# Patient Record
Sex: Male | Born: 1965 | Race: White | Hispanic: No | Marital: Married | Smoking: Never smoker
Health system: Southern US, Community
[De-identification: ages and names within clinical notes are randomized; demographics above are authoritative.]

## PROBLEM LIST (undated history)

## (undated) DIAGNOSIS — I10 Essential (primary) hypertension: Secondary | ICD-10-CM

## (undated) DIAGNOSIS — I4891 Unspecified atrial fibrillation: Secondary | ICD-10-CM

---

## 2010-11-30 ENCOUNTER — Ambulatory Visit: Payer: Self-pay | Admitting: Internal Medicine

## 2013-11-30 DIAGNOSIS — I491 Atrial premature depolarization: Secondary | ICD-10-CM | POA: Insufficient documentation

## 2014-03-09 LAB — BASIC METABOLIC PANEL
BUN: 14 mg/dL (ref 4–21)
Creatinine: 1 mg/dL (ref <=1.3)
Glucose: 95 mg/dL
Potassium: 4.3 mmol/L (ref 3.4–5.3)
Sodium: 142 mmol/L (ref 137–147)

## 2015-05-26 ENCOUNTER — Other Ambulatory Visit: Payer: Self-pay | Admitting: Family Medicine

## 2015-05-26 NOTE — Telephone Encounter (Signed)
PATIENT NEEDS TO SCHEDULE OFFICE VISIT

## 2015-09-26 ENCOUNTER — Other Ambulatory Visit: Payer: Self-pay | Admitting: Family Medicine

## 2015-09-29 ENCOUNTER — Telehealth: Payer: Self-pay | Admitting: Family Medicine

## 2015-09-29 NOTE — Telephone Encounter (Signed)
Pt contacted office for refill request on the following medications: Lisinopril-Hydrochlorothiazide 10-12.5 mg to [facility]. Pt was last seen on 03/09/14 and last filled on 05/13/14 with 10 refills. Pt stated he is out of the medication and scheduled a f/u for 10/11/15. Pt stated that is the quickest he could come in the office. Thanks TNP

## 2015-09-29 NOTE — Telephone Encounter (Signed)
Rx was sent into pharmacy 09/27/2015. Confirmed by pharmacy. Pt notified.

## 2015-10-10 DIAGNOSIS — I10 Essential (primary) hypertension: Secondary | ICD-10-CM | POA: Insufficient documentation

## 2015-10-10 DIAGNOSIS — R51 Headache: Secondary | ICD-10-CM

## 2015-10-10 DIAGNOSIS — R519 Headache, unspecified: Secondary | ICD-10-CM | POA: Insufficient documentation

## 2015-10-11 ENCOUNTER — Encounter: Payer: Self-pay | Admitting: Family Medicine

## 2015-10-11 ENCOUNTER — Ambulatory Visit (INDEPENDENT_AMBULATORY_CARE_PROVIDER_SITE_OTHER): Payer: Self-pay | Admitting: Family Medicine

## 2015-10-11 VITALS — BP 114/80 | HR 62 | Temp 98.8°F | Resp 16 | Ht 71.0 in | Wt 159.0 lb

## 2015-10-11 DIAGNOSIS — I10 Essential (primary) hypertension: Secondary | ICD-10-CM

## 2015-10-11 NOTE — Progress Notes (Signed)
       Patient: Peter Wyatt Male    DOB: [birth year]   49 y.o.   MRN: [id number] Visit Date: 10/11/2015  Today's Provider: Mila Merryonald Jovie Swanner, MD   Chief Complaint  Patient presents with  . Hypertension    follow up   Subjective:    HPI  Hypertension, follow-up:  BP Readings from Last 3 Encounters:  10/11/15 114/80  03/06/14 128/90    He was last seen for hypertension 1 years ago.  BP at that visit was 128/90. Management since that visit includes no changes. He reports good compliance with treatment. He is not having side effects.  He is exercising. He is not adherent to low salt diet.  Patient reports that he does not add extra salt to his meals. Outside blood pressures are not being checked. He is experiencing none.  Patient denies chest pain, chest pressure/discomfort, claudication, dyspnea, exertional chest pressure/discomfort, fatigue, irregular heart beat, lower extremity edema, near-syncope, orthopnea, palpitations, paroxysmal nocturnal dyspnea, syncope and tachypnea.   Cardiovascular risk factors include hypertension and male gender.  Use of agents associated with hypertension: none.     Weight trend: stable Wt Readings from Last 3 Encounters:  10/11/15 159 lb (72.122 kg)  03/06/14 162 lb (73.483 kg)    Current diet: Regular diet  ------------------------------------------------------------------------       No Known Allergies Previous Medications   LISINOPRIL-HYDROCHLOROTHIAZIDE (PRINZIDE,ZESTORETIC) 10-12.5 MG TABLET    Take 1 tablet by mouth daily.    Review of Systems  Constitutional: Negative for fever, chills and appetite change.  Respiratory: Negative for chest tightness, shortness of breath and wheezing.   Cardiovascular: Negative for chest pain and palpitations.  Gastrointestinal: Negative for nausea, vomiting and abdominal pain.    Social History  Substance Use Topics  . Smoking status: Never Smoker   . Smokeless tobacco: Not on file   . Alcohol Use: No   Objective:   BP 114/80 mmHg  Pulse 62  Temp(Src) 98.8 F (37.1 C) (Oral)  Resp 16  Ht 5\' 11"  (1.803 m)  Wt 159 lb (72.122 kg)  BMI 22.19 kg/m2  SpO2 98%  Physical Exam   General Appearance:    Alert, cooperative, no distress  Eyes:    PERRL, conjunctiva/corneas clear, EOM's intact       Lungs:     Clear to auscultation bilaterally, respirations unlabored  Heart:    Regular rate and rhythm  Neurologic:   Awake, alert, oriented x 3. No apparent focal neurological           defect.           Assessment & Plan:     1. Essential hypertension Well controlled.  Continue current medications.   - Lipid panel       Mila Merryonald Arhaan Chesnut, MD  [facility]

## 2015-10-12 LAB — LIPID PANEL
Chol/HDL Ratio: 2.2 ratio units (ref 0.0–5.0)
Cholesterol, Total: 123 mg/dL (ref 100–199)
HDL: 56 mg/dL (ref >39)
LDL Calculated: 49 mg/dL (ref 0–99)
Triglycerides: 88 mg/dL (ref 0–149)
VLDL Cholesterol Cal: 18 mg/dL (ref 5–40)

## 2015-10-27 ENCOUNTER — Other Ambulatory Visit: Payer: Self-pay | Admitting: Family Medicine

## 2015-10-27 MED ORDER — LISINOPRIL-HYDROCHLOROTHIAZIDE 10-12.5 MG PO TABS: 1.0000 | ORAL_TABLET | Freq: Every day | ORAL | Status: DC

## 2015-10-27 NOTE — Telephone Encounter (Signed)
Pt contacted office for refill request on the following medications:  lisinopril-hydrochlorothiazide (PRINZIDE,ZESTORETIC) 10-12.5 MG tablet.  [facility] [address]  WG#[phone]/HQCB#[phone]/MW

## 2016-11-05 ENCOUNTER — Other Ambulatory Visit: Payer: Self-pay | Admitting: Family Medicine

## 2016-11-06 ENCOUNTER — Telehealth: Payer: Self-pay | Admitting: Family Medicine

## 2016-11-06 NOTE — Telephone Encounter (Signed)
Pt contacted office for refill request on the following medications: lisinopril-hydrochlorothiazide (PRINZIDE,ZESTORETIC) 10-12.5 MG tablet  I advised pt that a 30 day supply was sent to the pharmacy today with no refills and he needed to schedule an OV for refills. Pt stated he was calling b/c he knows he needs to come in for OV but it would work better for him if he could wait until January. Pt last OV was on 10/11/15. Pt is requesting a refill to be added to the Rx that was sent to the pharmacy this morning so he has enough medication to last until January. Please advise. Thanks TNP

## 2016-12-05 ENCOUNTER — Other Ambulatory Visit: Payer: Self-pay | Admitting: Family Medicine

## 2017-01-01 ENCOUNTER — Ambulatory Visit: Payer: Self-pay | Admitting: Family Medicine

## 2017-01-07 ENCOUNTER — Encounter: Payer: Self-pay | Admitting: Family Medicine

## 2017-01-07 ENCOUNTER — Ambulatory Visit (INDEPENDENT_AMBULATORY_CARE_PROVIDER_SITE_OTHER): Payer: Self-pay | Admitting: Family Medicine

## 2017-01-07 VITALS — BP 118/76 | HR 66 | Temp 98.4°F | Resp 16 | Wt 169.0 lb

## 2017-01-07 DIAGNOSIS — I10 Essential (primary) hypertension: Secondary | ICD-10-CM

## 2017-01-07 MED ORDER — LISINOPRIL-HYDROCHLOROTHIAZIDE 10-12.5 MG PO TABS: ORAL_TABLET | ORAL | 2 refills | Status: DC

## 2017-01-07 NOTE — Progress Notes (Signed)
       Patient: Peter Wyatt Male    DOB: [birth year]   51 y.o.   MRN: [id number] Visit Date: 01/07/2017  Today's Provider: Mila Merryonald Ebunoluwa Gernert, MD   Chief Complaint  Patient presents with  . Hypertension    follow up   Subjective:    HPI  Hypertension, follow-up:  BP Readings from Last 3 Encounters:  10/11/15 114/80  03/06/14 128/90    He was last seen for hypertension 1 years ago.  BP at that visit was 114/80. Management since that visit includes no changes. He reports good compliance with treatment. He is not having side effects.  He is exercising. He is adherent to low salt diet.   Outside blood pressures are checked occasionally. He is experiencing none.  Patient denies chest pain, chest pressure/discomfort, claudication, dyspnea, exertional chest pressure/discomfort, fatigue, irregular heart beat, lower extremity edema, near-syncope, orthopnea, palpitations, paroxysmal nocturnal dyspnea, syncope and tachypnea.   Cardiovascular risk factors include hypertension.  Use of agents associated with hypertension: none.     Weight trend: increasing steadily Wt Readings from Last 3 Encounters:  10/11/15 159 lb (72.1 kg)  03/06/14 162 lb (73.5 kg)    Current diet: well balanced   ------------------------------------------------------------------------   Reports his son diagnosed with flu yesterday.   No Known Allergies   Current Outpatient Prescriptions:  .  lisinopril-hydrochlorothiazide (PRINZIDE,ZESTORETIC) 10-12.5 MG tablet, TAKE ONE TABLET BY MOUTH ONCE DAILY. APPOINTMENT NEEDED FOR FOLLOW UP., Disp: 30 tablet, Rfl: 0  Review of Systems  Constitutional: Negative for appetite change, chills and fever.  Respiratory: Negative for chest tightness, shortness of breath and wheezing.   Cardiovascular: Negative for chest pain and palpitations.  Gastrointestinal: Negative for abdominal pain, nausea and vomiting.    Social History  Substance Use Topics  . Smoking  status: Never Smoker  . Smokeless tobacco: Never Used  . Alcohol use No   Objective:   BP 118/76 (BP Location: Left Arm, Patient Position: Sitting, Cuff Size: Normal)   Pulse 66   Temp 98.4 F (36.9 C) (Oral)   Resp 16   Wt 169 lb (76.7 kg)   SpO2 98% Comment: room air  BMI 23.57 kg/m   Physical Exam   General Appearance:    Alert, cooperative, no distress  Eyes:    PERRL, conjunctiva/corneas clear, EOM's intact       Lungs:     Clear to auscultation bilaterally, respirations unlabored  Heart:    Regular rate and rhythm  Neurologic:   Awake, alert, oriented x 3. No apparent focal neurological           defect.           Assessment & Plan:     1. Essential hypertension Well controlled.  Continue current medications.  Follow up yearly if labs normal.   - lisinopril-hydrochlorothiazide (PRINZIDE,ZESTORETIC) 10-12.5 MG tablet; TAKE ONE TABLET BY MOUTH ONCE DAILY.  Dispense: 90 tablet; Refill: 2 - Renal function panel       Mila Merryonald Ohanna Gassert, MD  [facility]

## 2017-01-08 LAB — RENAL FUNCTION PANEL
Albumin: 4.5 g/dL (ref 3.5–5.5)
BUN/Creatinine Ratio: 13 (ref 9–20)
BUN: 14 mg/dL (ref 6–24)
CO2: 25 mmol/L (ref 18–29)
Calcium: 9.2 mg/dL (ref 8.7–10.2)
Chloride: 102 mmol/L (ref 96–106)
Creatinine, Ser: 1.04 mg/dL (ref 0.76–1.27)
GFR calc Af Amer: 97 mL/min/{1.73_m2} (ref >59)
GFR calc non Af Amer: 84 mL/min/{1.73_m2} (ref >59)
Glucose: 83 mg/dL (ref 65–99)
Phosphorus: 3.4 mg/dL (ref 2.5–4.5)
Potassium: 4 mmol/L (ref 3.5–5.2)
Sodium: 144 mmol/L (ref 134–144)

## 2017-01-14 ENCOUNTER — Ambulatory Visit (INDEPENDENT_AMBULATORY_CARE_PROVIDER_SITE_OTHER): Payer: Self-pay | Admitting: Family Medicine

## 2017-01-14 ENCOUNTER — Encounter: Payer: Self-pay | Admitting: Family Medicine

## 2017-01-14 VITALS — BP 122/80 | HR 60 | Temp 98.0°F | Resp 16 | Wt 165.0 lb

## 2017-01-14 DIAGNOSIS — J069 Acute upper respiratory infection, unspecified: Secondary | ICD-10-CM

## 2017-01-14 DIAGNOSIS — R05 Cough: Secondary | ICD-10-CM

## 2017-01-14 DIAGNOSIS — R059 Cough, unspecified: Secondary | ICD-10-CM

## 2017-01-14 MED ORDER — DOXYCYCLINE HYCLATE 100 MG PO TABS: 100.0000 mg | ORAL_TABLET | Freq: Two times a day (BID) | ORAL | 0 refills | Status: DC

## 2017-01-14 NOTE — Progress Notes (Signed)
       Patient: Peter Wyatt Male    DOB: 09/11/1966   51 y.o.   MRN: [id number] Visit Date: 01/14/2017  Today's Provider: Mila Merryonald Tymere Depuy, MD   Chief Complaint  Patient presents with  . Cough   Subjective:    Cough  This is a new problem. The current episode started in the past 7 days. The cough is non-productive. Associated symptoms include a fever, headaches, postnasal drip and shortness of breath. Associated symptoms comments: Up to 100.2 last night.. He has tried OTC cough suppressant for the symptoms. The treatment provided mild relief. There is no history of asthma or environmental allergies.  States his sone was diagnosed with flu with positive flu test 1 week ago. Patient started having cough day after his son was positive. States he took and Excedrin this morning due to headache.      No Known Allergies   Current Outpatient Prescriptions:  .  lisinopril-hydrochlorothiazide (PRINZIDE,ZESTORETIC) 10-12.5 MG tablet, TAKE ONE TABLET BY MOUTH ONCE DAILY., Disp: 90 tablet, Rfl: 2  Review of Systems  Constitutional: Positive for fever.  HENT: Positive for postnasal drip.   Respiratory: Positive for cough and shortness of breath.   Allergic/Immunologic: Negative for environmental allergies.  Neurological: Positive for headaches.    Social History  Substance Use Topics  . Smoking status: Never Smoker  . Smokeless tobacco: Never Used  . Alcohol use No   Objective:   BP 122/80 (BP Location: Right Arm, Patient Position: Sitting, Cuff Size: Normal)   Pulse 60   Temp 98 F (36.7 C)   Resp 16   Wt 165 lb (74.8 kg)   SpO2 98%   BMI 23.01 kg/m   Physical Exam   General Appearance:    Alert, cooperative, no distress  Eyes:    PERRL, conjunctiva/corneas clear, EOM's intact       Lungs:     Clear to auscultation bilaterally, respirations unlabored  Heart:    Regular rate and rhythm  Neurologic:   Awake, alert, oriented x 3. No apparent focal neurological            defect.           Assessment & Plan:     1. Upper respiratory tract infection, unspecified type He does have household exposure to flu positive child, but has been 6 days since onset of symptoms. He is concerned about secondary infections and had persistent case of suspected whooping cough a few years ago.  Counseled regarding signs and symptoms of viral and bacterial respiratory infections. Advised to start doxycycline  if he develops any sign of bacterial infection, or if current symptoms last longer than 10 days.   - doxycycline (VIBRA-TABS) 100 MG tablet; Take 1 tablet (100 mg total) by mouth 2 (two) times daily.  Dispense: 20 tablet; Refill: 0  2. Cough Improved today. Has responded well to OTC Delsym.        Mila Merryonald Shuronda Santino, MD  [facility]

## 2017-01-14 NOTE — Patient Instructions (Addendum)
   Fill prescription for doxycycline if you take a turn for the worse of if you are not much better within 5 days

## 2017-10-18 ENCOUNTER — Other Ambulatory Visit: Payer: Self-pay | Admitting: Family Medicine

## 2017-10-18 DIAGNOSIS — I10 Essential (primary) hypertension: Secondary | ICD-10-CM

## 2018-01-07 ENCOUNTER — Encounter: Payer: Self-pay | Admitting: Family Medicine

## 2018-01-07 ENCOUNTER — Telehealth: Payer: Self-pay | Admitting: Family Medicine

## 2018-01-07 ENCOUNTER — Ambulatory Visit: Payer: BLUE CROSS/BLUE SHIELD | Admitting: Family Medicine

## 2018-01-07 VITALS — BP 110/64 | HR 68 | Temp 97.9°F | Resp 16 | Ht 71.5 in | Wt 174.0 lb

## 2018-01-07 DIAGNOSIS — K1379 Other lesions of oral mucosa: Secondary | ICD-10-CM | POA: Diagnosis not present

## 2018-01-07 DIAGNOSIS — I10 Essential (primary) hypertension: Secondary | ICD-10-CM | POA: Diagnosis not present

## 2018-01-07 DIAGNOSIS — Z125 Encounter for screening for malignant neoplasm of prostate: Secondary | ICD-10-CM

## 2018-01-07 DIAGNOSIS — R361 Hematospermia: Secondary | ICD-10-CM

## 2018-01-07 DIAGNOSIS — Z1211 Encounter for screening for malignant neoplasm of colon: Secondary | ICD-10-CM

## 2018-01-07 DIAGNOSIS — K130 Diseases of lips: Secondary | ICD-10-CM

## 2018-01-07 MED ORDER — NAPROXEN 500 MG PO TABS: 500.0000 mg | ORAL_TABLET | Freq: Two times a day (BID) | ORAL | 0 refills | Status: AC

## 2018-01-07 NOTE — Patient Instructions (Signed)
   Call for referral to urology is you have any more episodes of blood in semen

## 2018-01-07 NOTE — Progress Notes (Signed)
Patient: Peter Wyatt Male    DOB: 1966/10/19   52 y.o.   MRN: [id number] Visit Date: 01/07/2018  Today's Provider: Mila Merryonald Norm Wray, MD   Chief Complaint  Patient presents with  . Hypertension   Subjective:    HPI  Hypertension, follow-up:  BP Readings from Last 3 Encounters:  01/07/18 110/64  01/14/17 122/80  01/07/17 118/76    He was last seen for hypertension 1 years ago.  BP at that visit was 118/76. Management since that visit includes no changes. He reports good compliance with treatment. He is not having side effects.  He is not exercising. He is adherent to low salt diet.   Outside blood pressures are not being checked. He is experiencing none.  Patient denies chest pressure/discomfort, exertional chest pressure/discomfort and palpitations.    Weight trend: stable Wt Readings from Last 3 Encounters:  01/07/18 174 lb (78.9 kg)  01/14/17 165 lb (74.8 kg)  01/07/17 169 lb (76.7 kg)    Current diet: well balanced   He complains that his left knee has been bothering for a couple of weeks which he thinks he injured playing sports. He has an active job on his feet all day. Doesn't recall specific injury or popping of knee. Has taken some OTC Excedrin when it keeps him up at night  Also complains of two episodes of blood in semen over the last couple of months. No pain. Has since had two ejaculations that were clear.   He also has a bump on his bottom lip that has been present for a few years. He thinks it started when he bit his lip. It will start to heal up but never goes completely away. Every few weeks or so he will bite it on accident and it will swell back up.      No Known Allergies   Current Outpatient Medications:  .  lisinopril-hydrochlorothiazide (PRINZIDE,ZESTORETIC) 10-12.5 MG tablet, TAKE ONE TABLET BY MOUTH ONCE DAILY, Disp: 90 tablet, Rfl: 3  Review of Systems  Constitutional: Negative.   Respiratory: Negative for cough.     Cardiovascular: Negative for chest pain, palpitations and leg swelling.  Musculoskeletal: Negative.   Neurological: Negative for dizziness, tremors, weakness, light-headedness, numbness and headaches.    Social History   Tobacco Use  . Smoking status: Never Smoker  . Smokeless tobacco: Never Used  Substance Use Topics  . Alcohol use: No   Objective:   BP 110/64 (BP Location: Right Arm, Patient Position: Sitting, Cuff Size: Normal)   Pulse 68   Temp 97.9 F (36.6 C)   Resp 16   Ht 5' 11.5" (1.816 m)   Wt 174 lb (78.9 kg)   SpO2 98%   BMI 23.93 kg/m  Vitals:   01/07/18 0801  BP: 110/64  Pulse: 68  Resp: 16  Temp: 97.9 F (36.6 C)  SpO2: 98%  Weight: 174 lb (78.9 kg)  Height: 5' 11.5" (1.816 m)     Physical Exam   General Appearance:    Alert, cooperative, no distress  Eyes:    PERRL, conjunctiva/corneas clear, EOM's intact       Lungs:     Clear to auscultation bilaterally, respirations unlabored  Heart:    Regular rate and rhythm  Neurologic:   Awake, alert, oriented x 3. No apparent focal neurological           defect.   MS:   Tender left biceps femoris tendon. No swelling or gross  deformities.        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  - Ambulatory referral to ENT  2. Blood in semen Advised that this is usually benign symptoms, however if he has any more episodes he should see urology for further evaluation.   3. Essential hypertension Well controlled.  Continue current medications.   - Lipid panel - Comprehensive metabolic panel  4. Prostate cancer screening  - PSA  5. Colon cancer screening  - Cologuard       [name], MD  [facility]

## 2018-01-07 NOTE — Telephone Encounter (Signed)
Order for cologuard faxed to [facility] °

## 2018-01-08 LAB — COMPREHENSIVE METABOLIC PANEL
ALT: 19 IU/L (ref 0–44)
AST: 23 IU/L (ref 0–40)
Albumin/Globulin Ratio: 2 (ref 1.2–2.2)
Albumin: 4.6 g/dL (ref 3.5–5.5)
Alkaline Phosphatase: 53 IU/L (ref 39–117)
BUN/Creatinine Ratio: 18 (ref 9–20)
BUN: 16 mg/dL (ref 6–24)
Bilirubin Total: 0.9 mg/dL (ref 0.0–1.2)
CO2: 27 mmol/L (ref 20–29)
Calcium: 9.5 mg/dL (ref 8.7–10.2)
Chloride: 99 mmol/L (ref 96–106)
Creatinine, Ser: 0.91 mg/dL (ref 0.76–1.27)
GFR calc Af Amer: 113 mL/min/{1.73_m2} (ref >59)
GFR calc non Af Amer: 98 mL/min/{1.73_m2} (ref >59)
Globulin, Total: 2.3 g/dL (ref 1.5–4.5)
Glucose: 88 mg/dL (ref 65–99)
Potassium: 4.3 mmol/L (ref 3.5–5.2)
Sodium: 141 mmol/L (ref 134–144)
Total Protein: 6.9 g/dL (ref 6.0–8.5)

## 2018-01-08 LAB — LIPID PANEL
Chol/HDL Ratio: 2.3 ratio (ref 0.0–5.0)
Cholesterol, Total: 127 mg/dL (ref 100–199)
HDL: 55 mg/dL (ref >39)
LDL Calculated: 53 mg/dL (ref 0–99)
Triglycerides: 94 mg/dL (ref 0–149)
VLDL Cholesterol Cal: 19 mg/dL (ref 5–40)

## 2018-01-08 LAB — PSA: Prostate Specific Ag, Serum: 0.6 ng/mL (ref 0.0–4.0)

## 2018-01-19 DIAGNOSIS — Z1211 Encounter for screening for malignant neoplasm of colon: Secondary | ICD-10-CM | POA: Diagnosis not present

## 2018-01-20 LAB — COLOGUARD: Cologuard: NEGATIVE

## 2018-02-09 DIAGNOSIS — D100 Benign neoplasm of lip: Secondary | ICD-10-CM | POA: Diagnosis not present

## 2018-02-09 DIAGNOSIS — D3701 Neoplasm of uncertain behavior of lip: Secondary | ICD-10-CM | POA: Diagnosis not present

## 2018-02-09 DIAGNOSIS — R52 Pain, unspecified: Secondary | ICD-10-CM | POA: Diagnosis not present

## 2018-02-09 DIAGNOSIS — K1379 Other lesions of oral mucosa: Secondary | ICD-10-CM | POA: Diagnosis not present

## 2018-03-24 ENCOUNTER — Telehealth: Payer: Self-pay

## 2018-03-24 NOTE — Telephone Encounter (Signed)
-----   Message from Malva Limesonald E Fisher, MD sent at 03/24/2018 12:28 PM EDT ----- Cologuard test is negative. Repeat in 3 years.

## 2018-03-24 NOTE — Telephone Encounter (Signed)
Patient was notified of results on 01/29/18  Notes are under labs.  Thanks,  -Anne Boltz

## 2018-04-01 ENCOUNTER — Inpatient Hospital Stay
Admission: EM | Admit: 2018-04-01 | Discharge: 2018-04-02 | DRG: 310 | Disposition: A | Discharge status: Home / Self Care | Payer: BLUE CROSS/BLUE SHIELD | Attending: Internal Medicine | Admitting: Internal Medicine

## 2018-04-01 ENCOUNTER — Emergency Department: Payer: BLUE CROSS/BLUE SHIELD

## 2018-04-01 ENCOUNTER — Encounter: Payer: Self-pay | Admitting: Emergency Medicine

## 2018-04-01 DIAGNOSIS — R079 Chest pain, unspecified: Secondary | ICD-10-CM | POA: Diagnosis not present

## 2018-04-01 DIAGNOSIS — I10 Essential (primary) hypertension: Secondary | ICD-10-CM | POA: Diagnosis present

## 2018-04-01 DIAGNOSIS — Z79899 Other long term (current) drug therapy: Secondary | ICD-10-CM

## 2018-04-01 DIAGNOSIS — E876 Hypokalemia: Secondary | ICD-10-CM | POA: Diagnosis not present

## 2018-04-01 DIAGNOSIS — Z8679 Personal history of other diseases of the circulatory system: Secondary | ICD-10-CM | POA: Diagnosis present

## 2018-04-01 DIAGNOSIS — I4891 Unspecified atrial fibrillation: Secondary | ICD-10-CM

## 2018-04-01 LAB — BASIC METABOLIC PANEL
Anion gap: 9 (ref 5–15)
BUN: 14 mg/dL (ref 6–20)
CO2: 26 mmol/L (ref 22–32)
Calcium: 9.5 mg/dL (ref 8.9–10.3)
Chloride: 103 mmol/L (ref 101–111)
Creatinine, Ser: 0.99 mg/dL (ref 0.61–1.24)
GFR calc Af Amer: >60 mL/min (ref >60)
GFR calc non Af Amer: >60 mL/min (ref >60)
Glucose, Bld: 197 mg/dL — ABNORMAL HIGH (ref 65–99)
Potassium: 3.1 mmol/L — ABNORMAL LOW (ref 3.5–5.1)
Sodium: 138 mmol/L (ref 135–145)

## 2018-04-01 LAB — CBC
HCT: 47.3 % (ref 40.0–52.0)
Hemoglobin: 16.7 g/dL (ref 13.0–18.0)
MCH: 30.1 pg (ref 26.0–34.0)
MCHC: 35.2 g/dL (ref 32.0–36.0)
MCV: 85.6 fL (ref 80.0–100.0)
Platelets: 163 10*3/uL (ref 150–440)
RBC: 5.53 MIL/uL (ref 4.40–5.90)
RDW: 13.1 % (ref 11.5–14.5)
WBC: 9 10*3/uL (ref 3.8–10.6)

## 2018-04-01 LAB — TROPONIN I: Troponin I: <0.03 ng/mL (ref <0.03)

## 2018-04-01 LAB — GLUCOSE, CAPILLARY: Glucose-Capillary: 153 mg/dL — ABNORMAL HIGH (ref 65–99)

## 2018-04-01 MED ORDER — MAGNESIUM SULFATE 4 GM/100ML IV SOLN
INTRAVENOUS | Status: AC
  Administered 2018-04-01: 4 g via INTRAVENOUS
  Filled 2018-04-01: qty 100

## 2018-04-01 MED ORDER — DILTIAZEM HCL 100 MG IV SOLR
5.0000 mg/h | Freq: Once | INTRAVENOUS | Status: AC
  Administered 2018-04-01: 5 mg/h via INTRAVENOUS
  Administered 2018-04-01: 10 mg/h via INTRAVENOUS
  Filled 2018-04-01: qty 100

## 2018-04-01 MED ORDER — SODIUM CHLORIDE 0.9 % IV BOLUS
1000.0000 mL | Freq: Once | INTRAVENOUS | Status: AC
  Administered 2018-04-01: 1000 mL via INTRAVENOUS

## 2018-04-01 MED ORDER — POTASSIUM CHLORIDE CRYS ER 20 MEQ PO TBCR
40.0000 meq | EXTENDED_RELEASE_TABLET | Freq: Once | ORAL | Status: AC
  Administered 2018-04-01: 40 meq via ORAL
  Filled 2018-04-01: qty 2

## 2018-04-01 MED ORDER — DILTIAZEM HCL 25 MG/5ML IV SOLN
20.0000 mg | Freq: Once | INTRAVENOUS | Status: AC
  Administered 2018-04-01: 20 mg via INTRAVENOUS

## 2018-04-01 MED ORDER — DILTIAZEM HCL 25 MG/5ML IV SOLN
INTRAVENOUS | Status: AC
  Administered 2018-04-01: 20 mg via INTRAVENOUS
  Filled 2018-04-01: qty 5

## 2018-04-01 MED ORDER — DILTIAZEM HCL 60 MG PO TABS: 90.0000 mg | ORAL_TABLET | Freq: Once | ORAL | Status: DC

## 2018-04-01 MED ORDER — DILTIAZEM HCL 60 MG PO TABS
60.0000 mg | ORAL_TABLET | Freq: Once | ORAL | Status: AC
  Administered 2018-04-01: 60 mg via ORAL
  Filled 2018-04-01: qty 1

## 2018-04-01 MED ORDER — LORAZEPAM 0.5 MG PO TABS
0.5000 mg | ORAL_TABLET | Freq: Once | ORAL | Status: AC
  Administered 2018-04-01: 0.5 mg via ORAL
  Filled 2018-04-01: qty 1

## 2018-04-01 MED ORDER — MAGNESIUM SULFATE 4 GM/100ML IV SOLN
4.0000 g | Freq: Once | INTRAVENOUS | Status: AC
  Administered 2018-04-01: 4 g via INTRAVENOUS
  Filled 2018-04-01: qty 100

## 2018-04-01 NOTE — ED Provider Notes (Signed)
[facility] Emergency Department Provider Note    First MD Initiated Contact with Patient 04/01/18 2103     (approximate)  I have reviewed the triage vital signs and the nursing notes.   HISTORY  Chief Complaint Palpitations    HPI Peter Wyatt is a 52 y.o. male resents with chief complaint of palpitations that started right around 52 tonight after the patient was playing basketball with his son.  States that he has had a long history of intermittent fluttering feeling in his chest and has had to be admitted to the hospital back in the early 1990s for what sounds like SVT or even ventricular tachycardia but is not on any anticoagulation or any anti-dysrhythmic medications chronically.  States he typically after he feels these fluttering sensations he is able to take a slow deep breath and calm himself and stop the palpitations but today it became more persistent.  History reviewed. No pertinent past medical history. History reviewed. No pertinent family history. History reviewed. No pertinent surgical history. Patient Active Problem List   Diagnosis Date Noted  . Headache 10/10/2015  . Hypertension 10/10/2015  . Premature atrial contractions 11/30/2013      Prior to Admission medications   Medication Sig Start Date End Date Taking? Authorizing Provider  lisinopril-hydrochlorothiazide (PRINZIDE,ZESTORETIC) 10-12.5 MG tablet TAKE ONE TABLET BY MOUTH ONCE DAILY 10/19/17   Malva Limes, MD    Allergies Patient has no known allergies.    Social History Social History   Tobacco Use  . Smoking status: Never Smoker  . Smokeless tobacco: Never Used  Substance Use Topics  . Alcohol use: No  . Drug use: No    Review of Systems Patient denies headaches, rhinorrhea, blurry vision, numbness, shortness of breath, chest pain, edema, cough, abdominal pain, nausea, vomiting, diarrhea, dysuria, fevers, rashes or hallucinations unless otherwise  stated above in HPI. ____________________________________________   PHYSICAL EXAM:  VITAL SIGNS: Vitals:   04/01/18 2055  BP: (!) 129/116  Pulse: 83  Resp: 14  SpO2: 100%    Constitutional: Alert and oriented. anxious appearing but in no acute distress. Eyes: Conjunctivae are normal.  Head: Atraumatic. Nose: No congestion/rhinnorhea. Mouth/Throat: Mucous membranes are moist.   Neck: No stridor. Painless ROM.  Cardiovascular: tachycardic, irregularly irregular rhythm. Grossly normal heart sounds.  Good peripheral circulation. Respiratory: Normal respiratory effort.  No retractions. Lungs CTAB. Gastrointestinal: Soft and nontender. No distention. No abdominal bruits. No CVA tenderness. Genitourinary:  Musculoskeletal: No lower extremity tenderness nor edema.  No joint effusions. Neurologic:  Normal speech and language. No gross focal neurologic deficits are appreciated. No facial droop Skin:  Skin is warm, dry and intact. No rash noted. Psychiatric: Mood and affect are normal. Speech and behavior are normal.  ____________________________________________   LABS (all labs ordered are listed, but only abnormal results are displayed)  Results for orders placed or performed during the hospital encounter of 04/01/18 (from the past 24 hour(s))  Basic metabolic panel     Status: Abnormal   Collection Time: 04/01/18  8:52 PM  Result Value Ref Range   Sodium 138 135 - 145 mmol/L   Potassium 3.1 (L) 3.5 - 5.1 mmol/L   Chloride 103 101 - 111 mmol/L   CO2 26 22 - 32 mmol/L   Glucose, Bld 197 (H) 65 - 99 mg/dL   BUN 14 6 - 20 mg/dL   Creatinine, Ser 5.27 0.61 - 1.24 mg/dL   Calcium 9.5 8.9 - 78.2 mg/dL  GFR calc non Af Amer >60 >60 mL/min   GFR calc Af Amer >60 >60 mL/min   Anion gap 9 5 - 15  CBC     Status: None   Collection Time: 04/01/18  8:52 PM  Result Value Ref Range   WBC 9.0 3.8 - 10.6 K/uL   RBC 5.53 4.40 - 5.90 MIL/uL   Hemoglobin 16.7 13.0 - 18.0 g/dL   HCT 82.947.3  56.240.0 - 13.052.0 %   MCV 85.6 80.0 - 100.0 fL   MCH 30.1 26.0 - 34.0 pg   MCHC 35.2 32.0 - 36.0 g/dL   RDW 86.513.1 78.411.5 - 69.614.5 %   Platelets 163 150 - 440 K/uL  Troponin I     Status: None   Collection Time: 04/01/18  8:52 PM  Result Value Ref Range   Troponin I <0.03 <0.03 ng/mL  Glucose, capillary     Status: Abnormal   Collection Time: 04/01/18 10:27 PM  Result Value Ref Range   Glucose-Capillary 153 (H) 65 - 99 mg/dL   ____________________________________________  EKG My review and personal interpretation at Time: 20:49   Indication: palpitations  Rate: 150  Rhythm: afib with rvr Axis: normal Other: no stemi, nonspecific st and t wave abn, likely rate dependent ____________________________________________  RADIOLOGY  I personally reviewed all radiographic images ordered to evaluate for the above acute complaints and reviewed radiology reports and findings.  These findings were personally discussed with the patient.  Please see medical record for radiology report.  ____________________________________________   PROCEDURES  Procedure(s) performed:  .Critical Care Performed by: Willy Eddyobinson, Tamaiya Bump, MD Authorized by: Willy Eddyobinson, Ambre Kobayashi, MD   Critical care provider statement:    Critical care time (minutes):  35   Critical care time was exclusive of:  Separately billable procedures and treating other patients   Critical care was necessary to treat or prevent imminent or life-threatening deterioration of the following conditions:  Cardiac failure   Critical care was time spent personally by me on the following activities:  Development of treatment plan with patient or surrogate, discussions with consultants, evaluation of patient's response to treatment, examination of patient, obtaining history from patient or surrogate, ordering and performing treatments and interventions, ordering and review of laboratory studies, ordering and review of radiographic studies, pulse oximetry,  re-evaluation of patient's condition and review of old charts      Critical Care performed: yes ____________________________________________   INITIAL IMPRESSION / ASSESSMENT AND PLAN / ED COURSE  Pertinent labs & imaging results that were available during my care of the patient were reviewed by me and considered in my medical decision making (see chart for details).  DDX: afib, dehydration, sepsis, electrolyte abn,  Peter Wyatt is a 52 y.o. who presents to the ED with palpitations and chest discomfort as described above.  Patient has evidence of A. fib with RVR.  Based on the paroxysmal on and off nature of this I do not feel that he is a candidate for ER cardioversion.  Will start with IV Cardizem bolus and drip as well as oral Cardizem and IV magnesium.  Blood work checked to evaluate for electrolyte abnormality shows mildly low potassium which will be repleted orally.  Clinical Course as of Apr 01 2320  Wed Apr 01, 2018  2215 Right overall is improving.  Will give additional bolus of IV Cardizem and reduce drip.  Blood work is otherwise reassuring does have mildly low potassium.   [PR]  2255 Patient with recurrent heart rate jumping  up to the 150s anytime he moves.  I have redosed oral as well as we will increase his IV drip.  May require admission to the hospital for unable to obtain adequate rate control.   [PR]    Clinical Course User Index [PR] Willy Eddy, MD     As part of my medical decision making, I reviewed the following data within the electronic MEDICAL RECORD NUMBER Nursing notes reviewed and incorporated, Labs reviewed, notes from prior ED visits and [place] Controlled Substance Database   ____________________________________________   FINAL CLINICAL IMPRESSION(S) / ED DIAGNOSES  Final diagnoses:  Atrial fibrillation with RVR (HCC)      NEW MEDICATIONS STARTED DURING THIS VISIT:  New Prescriptions   No medications on file     Note:  This  document was prepared using Dragon voice recognition software and may include unintentional dictation errors.    Willy Eddy, MD 04/01/18 [id number]

## 2018-04-01 NOTE — ED Notes (Signed)
ED Provider at bedside. 

## 2018-04-01 NOTE — ED Notes (Signed)
Give patient one time Cardizem 10mg  bolus, and place on 5mg /hr rate after bolus per Roxan Hockeyobinson MD [facility].

## 2018-04-01 NOTE — ED Triage Notes (Signed)
Pt c/o palpitations and irregular heart rate since 1930 tonight. Pt reports episode of same in 1995. Pt taken to [facility].

## 2018-04-02 ENCOUNTER — Other Ambulatory Visit: Payer: Self-pay

## 2018-04-02 ENCOUNTER — Inpatient Hospital Stay
Admission: EM | Admit: 2018-04-02 | Discharge: 2018-04-02 | Disposition: A | Discharge status: Home / Self Care | Payer: BLUE CROSS/BLUE SHIELD | Source: Home / Self Care | Attending: Internal Medicine | Admitting: Internal Medicine

## 2018-04-02 DIAGNOSIS — E876 Hypokalemia: Secondary | ICD-10-CM | POA: Diagnosis present

## 2018-04-02 DIAGNOSIS — I10 Essential (primary) hypertension: Secondary | ICD-10-CM | POA: Diagnosis not present

## 2018-04-02 DIAGNOSIS — Z79899 Other long term (current) drug therapy: Secondary | ICD-10-CM | POA: Diagnosis not present

## 2018-04-02 DIAGNOSIS — Z8679 Personal history of other diseases of the circulatory system: Secondary | ICD-10-CM | POA: Diagnosis present

## 2018-04-02 DIAGNOSIS — I4891 Unspecified atrial fibrillation: Secondary | ICD-10-CM | POA: Diagnosis not present

## 2018-04-02 LAB — CBC
HCT: 46.2 % (ref 40.0–52.0)
Hemoglobin: 16 g/dL (ref 13.0–18.0)
MCH: 30.1 pg (ref 26.0–34.0)
MCHC: 34.7 g/dL (ref 32.0–36.0)
MCV: 86.8 fL (ref 80.0–100.0)
Platelets: 163 10*3/uL (ref 150–440)
RBC: 5.33 MIL/uL (ref 4.40–5.90)
RDW: 13.1 % (ref 11.5–14.5)
WBC: 6.3 10*3/uL (ref 3.8–10.6)

## 2018-04-02 LAB — ECHOCARDIOGRAM COMPLETE
Height: 72 in
Weight: 2700.8 oz

## 2018-04-02 LAB — BASIC METABOLIC PANEL
Anion gap: 7 (ref 5–15)
BUN: 13 mg/dL (ref 6–20)
CO2: 24 mmol/L (ref 22–32)
Calcium: 8.5 mg/dL — ABNORMAL LOW (ref 8.9–10.3)
Chloride: 110 mmol/L (ref 101–111)
Creatinine, Ser: 0.76 mg/dL (ref 0.61–1.24)
GFR calc Af Amer: >60 mL/min (ref >60)
GFR calc non Af Amer: >60 mL/min (ref >60)
Glucose, Bld: 99 mg/dL (ref 65–99)
Potassium: 3.6 mmol/L (ref 3.5–5.1)
Sodium: 141 mmol/L (ref 135–145)

## 2018-04-02 LAB — GLUCOSE, CAPILLARY: Glucose-Capillary: 114 mg/dL — ABNORMAL HIGH (ref 65–99)

## 2018-04-02 MED ORDER — DILTIAZEM HCL 60 MG PO TABS
ORAL_TABLET | ORAL | Status: AC
  Filled 2018-04-02: qty 1

## 2018-04-02 MED ORDER — ONDANSETRON HCL 4 MG/2ML IJ SOLN: 4.0000 mg | Freq: Four times a day (QID) | INTRAMUSCULAR | Status: DC | PRN

## 2018-04-02 MED ORDER — HYDROCODONE-ACETAMINOPHEN 5-325 MG PO TABS: 1.0000 | ORAL_TABLET | ORAL | Status: DC | PRN

## 2018-04-02 MED ORDER — ACETAMINOPHEN 650 MG RE SUPP: 650.0000 mg | Freq: Four times a day (QID) | RECTAL | Status: DC | PRN

## 2018-04-02 MED ORDER — ONDANSETRON HCL 4 MG PO TABS: 4.0000 mg | ORAL_TABLET | Freq: Four times a day (QID) | ORAL | Status: DC | PRN

## 2018-04-02 MED ORDER — DOCUSATE SODIUM 100 MG PO CAPS: 100.0000 mg | ORAL_CAPSULE | Freq: Two times a day (BID) | ORAL | Status: DC

## 2018-04-02 MED ORDER — HEPARIN SODIUM (PORCINE) 5000 UNIT/ML IJ SOLN: 5000.0000 [IU] | Freq: Three times a day (TID) | INTRAMUSCULAR | Status: DC

## 2018-04-02 MED ORDER — POTASSIUM CHLORIDE 10 MEQ/100ML IV SOLN
10.0000 meq | INTRAVENOUS | Status: DC | PRN
  Filled 2018-04-02: qty 100

## 2018-04-02 MED ORDER — DILTIAZEM HCL ER COATED BEADS 240 MG PO CP24: 240.0000 mg | ORAL_CAPSULE | Freq: Every day | ORAL | 1 refills | Status: DC

## 2018-04-02 MED ORDER — ACETAMINOPHEN 325 MG PO TABS: 650.0000 mg | ORAL_TABLET | Freq: Four times a day (QID) | ORAL | Status: DC | PRN

## 2018-04-02 MED ORDER — BISACODYL 5 MG PO TBEC: 5.0000 mg | DELAYED_RELEASE_TABLET | Freq: Every day | ORAL | Status: DC | PRN

## 2018-04-02 NOTE — Discharge Summary (Signed)
[facility] - [place] at [facility]  Peter Wyatt, [place]51 y[place]o., DOB September 26, 1966, MRN [id number]. Admission date: 04/01/2018 Discharge Date 04/02/2018 Primary MD Malva LimesFisher, Donald E, MD Admitting Physician Cammy CopaAngela Maier, MD  Admission Diagnosis   1.  New onset atrial fibrillation 2.  Hypertension  Discharge Diagnosis       1.  Hypertension 2.  Atrial fibrillation resolved  Hospital Course  52 year old male patient with history of hypertension was admitted on 04/02/2018 for new onset atrial fibrillation to telemetry.  He was started on IV Cardizem drip and rate was controlled.  Patient converted to sinus rhythm.  Cardiology consultation was done by Dr. Juliann Paresallwood who recommended to stop HCTZ and lisinopril and start patient on oral Cardizem 240 mg daily at the time of discharge.  Patient was worked up with echocardiogram which showed EF of 60-65% with no wall motion abnormality.  His troponin was negative in the hospital.  Patient hemodynamically stable will be discharged home follow-up with primary care physician clinic.  Consults   Cardiology Dr. Juliann Paresallwood  Significant Tests:  See full reports for all details    Dg Chest 2 View  Result Date: 04/01/2018 CLINICAL DATA:  Chest pain, palpitations and irregular heart rate. EXAM: CHEST - 2 VIEW COMPARISON:  None. FINDINGS: Cardiomegaly with minimal aortic atherosclerosis. Pulmonary hyperinflation without edema, pneumothorax, effusion or pulmonary consolidation. No acute osseous abnormality. IMPRESSION: Mild cardiomegaly with minimal aortic atherosclerosis. No active pulmonary disease. Electronically Signed   By: Tollie Ethavid  Kwon M.D.   On: 04/01/2018 21:11       [name] is a 52 year old male patient lying on the bed in no acute distress No chest pain No shortness of breath No palpitations  Objective:   Blood pressure (!) 103/91, pulse 63, temperature 98.6 F (37 C), temperature source Oral, resp.  rate 20, height 6' (1.829 m), weight 76.6 kg (168 lb 12.8 oz), SpO2 100 %.  .  Intake/Output Summary (Last 24 hours) at 04/02/2018 1437 Last data filed at 04/02/2018 0300 Gross per 24 hour  Intake 1100 ml  Output 660 ml  Net 440 ml    Exam VITAL SIGNS: Blood pressure (!) 103/91, pulse 63, temperature 98.6 F (37 C), temperature source Oral, resp. rate 20, height 6' (1.829 m), weight 76.6 kg (168 lb 12.8 oz), SpO2 100 %.  GENERAL:  52 y.o.-year-old patient lying in the bed with no acute distress.  EYES: Pupils equal, round, reactive to light and accommodation. No scleral icterus. Extraocular muscles intact.  HEENT: Head atraumatic, normocephalic. Oropharynx and nasopharynx clear.  NECK:  Supple, no jugular venous distention. No thyroid enlargement, no tenderness.  LUNGS: Normal breath sounds bilaterally, no wheezing, rales,rhonchi or crepitation. No use of accessory muscles of respiration.  CARDIOVASCULAR: S1, S2 normal. No murmurs, rubs, or gallops.  ABDOMEN: Soft, nontender, nondistended. Bowel sounds present. No organomegaly or mass.  EXTREMITIES: No pedal edema, cyanosis, or clubbing.  NEUROLOGIC: Cranial nerves II through XII are intact. Muscle strength 5/5 in all extremities. Sensation intact. Gait not checked.  PSYCHIATRIC: The patient is alert and oriented x 3.  SKIN: No obvious rash, lesion, or ulcer.   Data Review     CBC w Diff:  Lab Results  Component Value Date   WBC 6.3 04/02/2018   HGB 16.0 04/02/2018   HCT 46.2 04/02/2018   PLT 163 04/02/2018   CMP:  Lab Results  Component Value Date   NA 141 04/02/2018   NA  141 01/07/2018   K 3.6 04/02/2018   CL 110 04/02/2018   CO2 24 04/02/2018   BUN 13 04/02/2018   BUN 16 01/07/2018   CREATININE 0.76 04/02/2018   GLU 95 03/09/2014   PROT 6.9 01/07/2018   ALBUMIN 4.6 01/07/2018   BILITOT 0.9 01/07/2018   ALKPHOS 53 01/07/2018   AST 23 01/07/2018   ALT 19 01/07/2018  .  Micro Results No results found for this  or any previous visit (from the past 240 hour(s)).      Code Status Orders  (From admission, onward)        Start     Ordered   04/02/18 0429  Full code  Continuous     04/02/18 0428    Code Status History    This patient has a current code status but no historical code status.          Follow-up Information    Malva Limes, MD Follow up in 1 week(s).   Specialty:  Family Medicine Contact information: [address] [place] [address] [phone]           Discharge Medications   Allergies as of 04/02/2018   No Known Allergies     Medication List    STOP taking these medications   lisinopril-hydrochlorothiazide 10-12.5 MG tablet Commonly known as:  PRINZIDE,ZESTORETIC     TAKE these medications   diltiazem 240 MG 24 hr capsule Commonly known as:  CARTIA XT Take 1 capsule (240 mg total) by mouth daily.          Total Time in preparing paper work, data evaluation and todays exam - 35 minutes  Ihor Austin M.D on 04/02/2018 at 2:37 PM [facility]   Office  [phone]

## 2018-04-02 NOTE — H&P (Signed)
[facility] - [place] at [facility]   PATIENT NAME: Peter Wyatt    MR#:  [id number]  DATE OF BIRTH:  03-19-1966  DATE OF ADMISSION:  04/01/2018  PRIMARY CARE PHYSICIAN: Malva Limes, MD   REQUESTING/REFERRING PHYSICIAN:   CHIEF COMPLAINT:   Chief Complaint  Patient presents with  . Palpitations    HISTORY OF PRESENT ILLNESS: Peter Wyatt  is a 52 y.o. male, without significant medical history. Patient presented to emergency room for acute onset of palpitations started suddenly, approximately 1-2 hours before arrival to emergency room, while playing basketball with his son at home.  Patient admits to having a very stressful day.  He has had episodes of fluttering in the chest before he has been able to control them by taking a deep breath and relaxing.  This time however, his efforts were not unsuccessful, therefore patient came to emergency room. Blood test done in the emergency room are notable for low potassium level at 3.1.  Chest x-ray, reviewed by myself, is noted without any acute cardiopulmonary disease.  EKG shows atrial fibrillation with rapid ventricular response, HR in 150s; no acute ischemic changes. Patient is admitted for further evaluation and treatment.   PAST MEDICAL HISTORY:  History reviewed. No pertinent past medical history.  PAST SURGICAL HISTORY: History reviewed. No pertinent surgical history.  SOCIAL HISTORY:  Social History   Tobacco Use  . Smoking status: Never Smoker  . Smokeless tobacco: Never Used  Substance Use Topics  . Alcohol use: No    FAMILY HISTORY: History reviewed. No pertinent family history.  DRUG ALLERGIES: No Known Allergies  REVIEW OF SYSTEMS:   CONSTITUTIONAL: No fever, fatigue or weakness.  EYES: No blurred or double vision.  EARS, NOSE, AND THROAT: No tinnitus or ear pain.  RESPIRATORY: No cough, shortness of breath, wheezing or hemoptysis.  CARDIOVASCULAR: No chest pain, orthopnea,  edema.  Positive for palpitations. GASTROINTESTINAL: No nausea, vomiting, diarrhea or abdominal pain.  GENITOURINARY: No dysuria, hematuria.  ENDOCRINE: No polyuria, nocturia,  HEMATOLOGY: No anemia, easy bruising or bleeding SKIN: No rash or lesion. MUSCULOSKELETAL: No joint pain or arthritis.   NEUROLOGIC: No tingling, numbness, weakness.  PSYCHIATRY: No anxiety or depression.   MEDICATIONS AT HOME:  Prior to Admission medications   Medication Sig Start Date End Date Taking? Authorizing Provider  lisinopril-hydrochlorothiazide (PRINZIDE,ZESTORETIC) 10-12.5 MG tablet TAKE ONE TABLET BY MOUTH ONCE DAILY 10/19/17  Yes Malva Limes, MD      PHYSICAL EXAMINATION:   VITAL SIGNS: Blood pressure 119/78, pulse 61, temperature 97.7 F (36.5 C), temperature source Oral, resp. rate 18, height 6' (1.829 m), weight 76.6 kg (168 lb 12.8 oz), SpO2 98 %.  GENERAL:  52 y.o.-year-old patient lying in the bed with no acute distress.  EYES: Pupils equal, round, reactive to light and accommodation. No scleral icterus.  HEENT: Head atraumatic, normocephalic. Oropharynx and nasopharynx clear.  NECK:  Supple, no jugular venous distention. No thyroid enlargement, no tenderness.  LUNGS: Normal breath sounds bilaterally, no wheezing, rales,rhonchi or crepitation. No use of accessory muscles of respiration.  CARDIOVASCULAR: S1, S2 normal. No murmurs, rubs, or gallops.  ABDOMEN: Soft, nontender, nondistended. Bowel sounds present. No organomegaly or mass.  EXTREMITIES: No pedal edema, cyanosis, or clubbing.  NEUROLOGIC: Cranial nerves II through XII are intact. Muscle strength 5/5 in all extremities. Sensation intact.  PSYCHIATRIC: The patient is alert and oriented x 3.  SKIN: No obvious rash, lesion, or ulcer.   LABORATORY PANEL:  CBC Recent Labs  Lab 04/01/18 2052  WBC 9.0  HGB 16.7  HCT 47.3  PLT 163  MCV 85.6  MCH 30.1  MCHC 35.2  RDW 13.1    ------------------------------------------------------------------------------------------------------------------  Chemistries  Recent Labs  Lab 04/01/18 2052  NA 138  K 3.1*  CL 103  CO2 26  GLUCOSE 197*  BUN 14  CREATININE 0.99  CALCIUM 9.5   ------------------------------------------------------------------------------------------------------------------ estimated creatinine clearance is 96.7 mL/min (by C-G formula based on SCr of 0.99 mg/dL). ------------------------------------------------------------------------------------------------------------------ No results for input(s): TSH, T4TOTAL, T3FREE, THYROIDAB in the last 72 hours.  Invalid input(s): FREET3   Coagulation profile No results for input(s): INR, PROTIME in the last 168 hours. ------------------------------------------------------------------------------------------------------------------- No results for input(s): DDIMER in the last 72 hours. -------------------------------------------------------------------------------------------------------------------  Cardiac Enzymes Recent Labs  Lab 04/01/18 2052  TROPONINI <0.03   ------------------------------------------------------------------------------------------------------------------ Invalid input(s): POCBNP  ---------------------------------------------------------------------------------------------------------------  Urinalysis No results found for: COLORURINE, APPEARANCEUR, LABSPEC, PHURINE, GLUCOSEU, HGBUR, BILIRUBINUR, KETONESUR, PROTEINUR, UROBILINOGEN, NITRITE, LEUKOCYTESUR   RADIOLOGY: Dg Chest 2 View  Result Date: 04/01/2018 CLINICAL DATA:  Chest pain, palpitations and irregular heart rate. EXAM: CHEST - 2 VIEW COMPARISON:  None. FINDINGS: Cardiomegaly with minimal aortic atherosclerosis. Pulmonary hyperinflation without edema, pneumothorax, effusion or pulmonary consolidation. No acute osseous abnormality. IMPRESSION: Mild cardiomegaly  with minimal aortic atherosclerosis. No active pulmonary disease. Electronically Signed   By: Tollie Ethavid  Kwon M.D.   On: 04/01/2018 21:11    EKG: Orders placed or performed during the hospital encounter of 04/01/18  . ED EKG within 10 minutes  . ED EKG within 10 minutes  . EKG 12-Lead  . EKG 12-Lead    IMPRESSION AND PLAN:   1.  Atrial fibrillation with RVR, new onset.  We will start patient on Cardizem drip and continue to monitor closely on telemetry.  We will check 2D echo.  Cardiology is consulted for further evaluation and treatment. 2.  Hypertension, stable.  We will control BP with Cardizem for now.     All the records are reviewed and case discussed with ED provider. Management plans discussed with the patient, family and they are in agreement.  CODE STATUS:    Code Status Orders  (From admission, onward)        Start     Ordered   04/02/18 0429  Full code  Continuous     04/02/18 0428    Code Status History    This patient has a current code status but no historical code status.       TOTAL TIME TAKING CARE OF THIS PATIENT: 45 minutes.    Cammy CopaAngela Meril Dray M.D on 04/02/2018 at 5:46 AM  Between 7am to 6pm - Pager - [phone]  After 6pm go to [email or web address] - password [email or web address] [facility]  [facility]  Office  [phone]  CC: Primary care physician; Malva LimesFisher, Donald E, MD

## 2018-04-02 NOTE — Progress Notes (Signed)
*  PRELIMINARY RESULTS* Echocardiogram 2D Echocardiogram has been performed.  Cristela BlueHege, Nicolena Schurman 04/02/2018, 12:17 PM

## 2018-04-02 NOTE — Progress Notes (Signed)
IV and tele removed. Prescriptions given to pt. Discharge instructions given to pt. A & O. NSR. Room air. Pt has no further concerns at this time.

## 2018-04-02 NOTE — Consult Note (Signed)
Reason for Consult: Atrial fibrillation rapid ventricular response Referring Physician: Dr. Duane Boston hospitalist  Peter Wyatt is an 52 y.o. male.  HPI: Patient is a 52 year old white male no significant medical history except for hypertension states he has had trouble with palpitations and tachycardia in the past in fact had an admission back in 1995 but was treated conservatively.  Patient states he has recurrent short bouts of palpitations and tachycardia.  But they usually go away on their own.  This last time while playing basketball with his son he started having palpitations he did not stop and relax and continued on and then started having worsening palpitations with heart racing patient started to feel poorly lightheaded and weak so finally came to the emergency room was found to be in A. fib.  Patient was given Cardizem IV for rate of 150 and subsequently had his rate slowed down and he began to feel somewhat better patient now here for cardiac assessment evaluation had mild vague chest pain mild shortness of breath no previous CVA does not have diabetes  History reviewed. No pertinent past medical history.  History reviewed. No pertinent surgical history.  History reviewed. No pertinent family history.  Social History:  reports that he has never smoked. He has never used smokeless tobacco. He reports that he does not drink alcohol or use drugs.  Allergies: No Known Allergies  Medications: I have reviewed the patient's current medications.  Results for orders placed or performed during the hospital encounter of 04/01/18 (from the past 48 hour(s))  Basic metabolic panel     Status: Abnormal   Collection Time: 04/01/18  8:52 PM  Result Value Ref Range   Sodium 138 135 - 145 mmol/L   Potassium 3.1 (L) 3.5 - 5.1 mmol/L   Chloride 103 101 - 111 mmol/L   CO2 26 22 - 32 mmol/L   Glucose, Bld 197 (H) 65 - 99 mg/dL   BUN 14 6 - 20 mg/dL   Creatinine, Ser 0.99 0.61 - 1.24 mg/dL   Calcium 9.5 8.9 - 10.3 mg/dL   GFR calc non Af Amer >60 >60 mL/min   GFR calc Af Amer >60 >60 mL/min    Comment: (NOTE) The eGFR has been calculated using the CKD EPI equation. This calculation has not been validated in all clinical situations. eGFR's persistently <60 mL/min signify possible Chronic Kidney Disease.    Anion gap 9 5 - 15    Comment: Performed at [facility], [address] [place], [place] [address]  CBC     Status: None   Collection Time: 04/01/18  8:52 PM  Result Value Ref Range   WBC 9.0 3.8 - 10.6 K/uL   RBC 5.53 4.40 - 5.90 MIL/uL   Hemoglobin 16.7 13.0 - 18.0 g/dL   HCT 47.3 40.0 - 52.0 %   MCV 85.6 80.0 - 100.0 fL   MCH 30.1 26.0 - 34.0 pg   MCHC 35.2 32.0 - 36.0 g/dL   RDW 13.1 11.5 - 14.5 %   Platelets 163 150 - 440 K/uL    Comment: Performed at [facility], [address] [place], [place] [address]  Troponin I     Status: None   Collection Time: 04/01/18  8:52 PM  Result Value Ref Range   Troponin I <0.03 <0.03 ng/mL    Comment: Performed at [facility], [address] [place], [place] [address]  Glucose, capillary     Status: Abnormal   Collection Time:  04/01/18 10:27 PM  Result Value Ref Range   Glucose-Capillary 153 (H) 65 - 99 mg/dL  Basic metabolic panel     Status: Abnormal   Collection Time: 04/02/18  6:12 AM  Result Value Ref Range   Sodium 141 135 - 145 mmol/L   Potassium 3.6 3.5 - 5.1 mmol/L   Chloride 110 101 - 111 mmol/L   CO2 24 22 - 32 mmol/L   Glucose, Bld 99 65 - 99 mg/dL   BUN 13 6 - 20 mg/dL   Creatinine, Ser 0.76 0.61 - 1.24 mg/dL   Calcium 8.5 (L) 8.9 - 10.3 mg/dL   GFR calc non Af Amer >60 >60 mL/min   GFR calc Af Amer >60 >60 mL/min    Comment: (NOTE) The eGFR has been calculated using the CKD EPI equation. This calculation has not been validated in all clinical situations. eGFR's persistently <60 mL/min signify possible Chronic Kidney Disease.    Anion gap 7 5 - 15    Comment:  Performed at [facility], [address] [place], [place] [address]  CBC     Status: None   Collection Time: 04/02/18  6:12 AM  Result Value Ref Range   WBC 6.3 3.8 - 10.6 K/uL   RBC 5.33 4.40 - 5.90 MIL/uL   Hemoglobin 16.0 13.0 - 18.0 g/dL   HCT 46.2 40.0 - 52.0 %   MCV 86.8 80.0 - 100.0 fL   MCH 30.1 26.0 - 34.0 pg   MCHC 34.7 32.0 - 36.0 g/dL   RDW 13.1 11.5 - 14.5 %   Platelets 163 150 - 440 K/uL    Comment: Performed at [facility], [address] [place], [place] [address]  Glucose, capillary     Status: Abnormal   Collection Time: 04/02/18  7:46 AM  Result Value Ref Range   Glucose-Capillary 114 (H) 65 - 99 mg/dL    Dg Chest 2 View  Result Date: 04/01/2018 CLINICAL DATA:  Chest pain, palpitations and irregular heart rate. EXAM: CHEST - 2 VIEW COMPARISON:  None. FINDINGS: Cardiomegaly with minimal aortic atherosclerosis. Pulmonary hyperinflation without edema, pneumothorax, effusion or pulmonary consolidation. No acute osseous abnormality. IMPRESSION: Mild cardiomegaly with minimal aortic atherosclerosis. No active pulmonary disease. Electronically Signed   By: Ashley Royalty M.D.   On: 04/01/2018 21:11    Review of Systems  Constitutional: Positive for diaphoresis and malaise/fatigue.  HENT: Negative.   Eyes: Negative.   Respiratory: Positive for shortness of breath.   Cardiovascular: Positive for chest pain.  Gastrointestinal: Negative.   Genitourinary: Negative.   Musculoskeletal: Negative.   Skin: Negative.   Neurological: Negative.   Endo/Heme/Allergies: Negative.   Psychiatric/Behavioral: Negative.    Blood pressure (!) 103/91, pulse 63, temperature 98.6 F (37 C), temperature source Oral, resp. rate 20, height 6' (1.829 m), weight 168 lb 12.8 oz (76.6 kg), SpO2 100 %. Physical Exam  Nursing note and vitals reviewed. Constitutional: He is oriented to person, place, and time. He appears well-developed and well-nourished.  HENT:  Head:  Normocephalic and atraumatic.  Eyes: Pupils are equal, round, and reactive to light. Conjunctivae and EOM are normal.  Neck: Normal range of motion. Neck supple.  Respiratory: Effort normal and breath sounds normal.  GI: Soft. Bowel sounds are normal.  Musculoskeletal: Normal range of motion.  Neurological: He is alert and oriented to person, place, and time. He has normal reflexes.  Skin: Skin is warm and dry.  Psychiatric: He has a normal mood and  affect.    Assessment/Plan: Atrial fibrillation Palpitations Hypertension Hypokalemia . Plan Agree with telemetry for following up palpitations and A. Fib Do not recommend long-term anticoagulation at this point because of his low chads score Diltiazem 240 once a day to help with blood pressure control as well as rate control for A. Fib Discontinue lisinopril in favor of diltiazem Recommend aspirin therapy as a sole anticoagulant 81 mg Correct electrolytes especially potassium will also investigate magnesium Recommend avoid stimulants like caffeine or energy drinks The patient follow-up as an outpatient   Dwayne D Callwood 04/02/2018, 2:30 PM

## 2018-04-02 NOTE — Plan of Care (Signed)
  Problem: Education: Goal: Knowledge of General Education information will improve Outcome: Progressing   Problem: Clinical Measurements: Goal: Ability to maintain clinical measurements within normal limits will improve Outcome: Progressing Goal: Will remain free from infection Outcome: Progressing Goal: Diagnostic test results will improve Outcome: Progressing Goal: Respiratory complications will improve Outcome: Progressing Goal: Cardiovascular complication will be avoided Outcome: Progressing   Problem: Activity: Goal: Risk for activity intolerance will decrease Outcome: Progressing   

## 2018-04-03 ENCOUNTER — Telehealth: Payer: Self-pay

## 2018-04-03 LAB — HIV ANTIBODY (ROUTINE TESTING W REFLEX): HIV Screen 4th Generation wRfx: NONREACTIVE

## 2018-04-03 NOTE — Telephone Encounter (Signed)
Transition Care Management Follow-Up Telephone Call   [facility]  How have you been since you were released from the hospital? Doing better, came home yesterday, ate supper and went to bed. Pt slept for 12 hours. Pt states he is tired and exhausted but he thinks thats related to the hospital stay and not sleeping while there. Pt states this all started after taking an Excedrin and drinking a pepsi. Pt plans to avoid this in the future and rest when he feels his heart rate increase.   Any patient concerns? None.  Items Reviewed:   Meds: verified  Allergies: verified  Dietary Changes Reviewed: N/A  Functional Questionnaire:  Independent-I Dependent-D  ADLs:   Dressing- I    Eating- I   Maintaining continence- I   Transferring- I   Transportation- I   Meal Prep- I   Managing Meds- I  Confirmed importance and Date/Time of follow-up visits scheduled: 04/09/18 @ 8:40 AM   Confirmed with patient if condition worsens to call PCP or go to the Emergency Dept. Patient was given office number and encouraged to call back with questions or concerns: YES

## 2018-04-09 ENCOUNTER — Ambulatory Visit: Payer: BLUE CROSS/BLUE SHIELD | Admitting: Family Medicine

## 2018-04-09 ENCOUNTER — Encounter: Payer: Self-pay | Admitting: Family Medicine

## 2018-04-09 VITALS — BP 132/92 | HR 64 | Temp 97.8°F | Resp 16 | Wt 173.0 lb

## 2018-04-09 DIAGNOSIS — E876 Hypokalemia: Secondary | ICD-10-CM

## 2018-04-09 DIAGNOSIS — I10 Essential (primary) hypertension: Secondary | ICD-10-CM

## 2018-04-09 DIAGNOSIS — I480 Paroxysmal atrial fibrillation: Secondary | ICD-10-CM | POA: Diagnosis not present

## 2018-04-09 NOTE — Progress Notes (Signed)
       Patient: Peter RoteMichael W Dhondt Male    DOB: 1966/07/11   52 y.o.   MRN: [id number] Visit Date: 04/09/2018  Today's Provider: Mila Merryonald Fisher, MD   Chief Complaint  Patient presents with  . Hospitalization Follow-up   Subjective:    HPI   Follow up Hospitalization  Patient was [facility] and discharged on 04/02/2018. He was treated for A-fib. Treatment for this included; started on IV Cardizem drip and rate controlled. Patient converted to sinus rhythm in the hospital. HCTZ and lisinopril was discontinued. Patient was started on oral Cardizem 240 mg.  Telephone follow up was done on 04/03/2018 He reports good compliance with treatment. He reports this condition is Improved. He was noted to be hypokalemic upon admission. He has since been eating more bananas and other fruits.   He has had no episodes of racing heart or palpations since discharge.  Anti-coagulation was deferred due to low CHADs score.  ------------------------------------------------------------------------------------     No Known Allergies   Current Outpatient Medications:  .  aspirin-acetaminophen-caffeine (EXCEDRIN MIGRAINE) 250-250-65 MG tablet, Take 1 tablet by mouth every 6 (six) hours as needed for headache., Disp: , Rfl:  .  diltiazem (CARTIA XT) 240 MG 24 hr capsule, Take 1 capsule (240 mg total) by mouth daily., Disp: 30 capsule, Rfl: 1  Review of Systems  Constitutional: Negative for appetite change, chills and fever.  Respiratory: Negative for chest tightness, shortness of breath and wheezing.   Cardiovascular: Negative for chest pain and palpitations.  Gastrointestinal: Negative for abdominal pain, nausea and vomiting.    Social History   Tobacco Use  . Smoking status: Never Smoker  . Smokeless tobacco: Never Used  Substance Use Topics  . Alcohol use: No   Objective:   BP (!) 132/92 (BP Location: Right Arm, Cuff Size: Large)   Pulse 64   Temp 97.8 F (36.6 C)  (Oral)   Resp 16   Wt 173 lb (78.5 kg)   SpO2 98% Comment: room air  BMI 23.46 kg/m  Vitals:   04/09/18 0852 04/09/18 0856  BP: 132/90 (!) 132/92  Pulse: 64   Resp: 16   Temp: 97.8 F (36.6 C)   TempSrc: Oral   SpO2: 98%   Weight: 173 lb (78.5 kg)      Physical Exam   General Appearance:    Alert, cooperative, no distress  Eyes:    PERRL, conjunctiva/corneas clear, EOM's intact       Lungs:     Clear to auscultation bilaterally, respirations unlabored  Heart:    Regular rate and rhythm            Assessment & Plan:     1. Paroxysmal atrial fibrillation (HCC) SR maintained on diltiazem. Continue current medications.   Counseled to avoid stimulants such as caffeine and oral decongestants. Avoid excessive alcohol consumption.   2. Essential hypertension Off lisinopril since starting diltiazem. BP up from his baseline, but still controlled. Recheck in 3 months.   3. Hypokalemia  - Renal function panel - Magnesium       Mila Merryonald Fisher, MD  [facility]

## 2018-04-10 LAB — RENAL FUNCTION PANEL
Albumin: 4.5 g/dL (ref 3.5–5.5)
BUN/Creatinine Ratio: 17 (ref 9–20)
BUN: 16 mg/dL (ref 6–24)
CO2: 25 mmol/L (ref 20–29)
Calcium: 9.2 mg/dL (ref 8.7–10.2)
Chloride: 103 mmol/L (ref 96–106)
Creatinine, Ser: 0.93 mg/dL (ref 0.76–1.27)
GFR calc Af Amer: 110 mL/min/{1.73_m2} (ref >59)
GFR calc non Af Amer: 95 mL/min/{1.73_m2} (ref >59)
Glucose: 103 mg/dL — ABNORMAL HIGH (ref 65–99)
Phosphorus: 3.6 mg/dL (ref 2.5–4.5)
Potassium: 3.9 mmol/L (ref 3.5–5.2)
Sodium: 141 mmol/L (ref 134–144)

## 2018-04-10 LAB — MAGNESIUM: Magnesium: 2.2 mg/dL (ref 1.6–2.3)

## 2018-06-02 ENCOUNTER — Telehealth: Payer: Self-pay | Admitting: Family Medicine

## 2018-06-02 MED ORDER — DILTIAZEM HCL ER COATED BEADS 240 MG PO CP24: 240.0000 mg | ORAL_CAPSULE | Freq: Every day | ORAL | 5 refills | Status: DC

## 2018-06-02 NOTE — Telephone Encounter (Signed)
Prescription [facility] 

## 2018-06-02 NOTE — Telephone Encounter (Signed)
Pt stated he was started on diltiazem (CARTIA XT) 240 MG 24 hr capsule in the hospital and followed up with Dr. Sherrie MustacheFisher on 04/09/18. Pt stated he is out of the medication and is asking for Dr. Sherrie MustacheFisher to take over the medication and [facility] [address]. Please advise. Thanks TNP

## 2018-06-02 NOTE — Telephone Encounter (Signed)
Please advise refill? 

## 2018-06-03 NOTE — Telephone Encounter (Signed)
Patient advised.

## 2018-07-10 NOTE — Progress Notes (Deleted)
       Patient: Larwance RoteMichael W Dorris Male    DOB: [birth year]   52 y.o.   MRN: [id number] Visit Date: 07/10/2018  Today's Provider: Mila Merryonald Fisher, MD   No chief complaint on file.  Subjective:    HPI   Hypertension, follow-up:  BP Readings from Last 3 Encounters:  04/09/18 (!) 132/92  04/02/18 (!) 103/91  01/07/18 110/64    He was last seen for hypertension 3 months ago.  BP at that visit was 132/92. Management since that visit includes; no changes. Will recheck in 3 months.He reports {excellent/good/fair/poor:19665} compliance with treatment. He {ACTION; IS/IS [id number]} having side effects. *** He {is/is not:9024} exercising. He {is/is not:9024} adherent to low salt diet.   Outside blood pressures are ***. He is experiencing {Symptoms; cardiac:12860}.  Patient denies {Symptoms; cardiac:12860}.   Cardiovascular risk factors include {cv risk factors:510}.  Use of agents associated with hypertension: {bp agents assoc with hypertension:511::"none"}.   ------------------------------------------------------------------------  Paroxysmal atrial fibrillation (HCC) From 04/09/2018-Counseled to avoid stimulants such as caffeine and oral decongestants. Avoid excessive alcohol consumption.   Hypokalemia From 04/09/2018-labs checked, normal.    No Known Allergies   Current Outpatient Medications:  .  aspirin-acetaminophen-caffeine (EXCEDRIN MIGRAINE) 250-250-65 MG tablet, Take 1 tablet by mouth every 6 (six) hours as needed for headache., Disp: , Rfl:  .  diltiazem (CARTIA XT) 240 MG 24 hr capsule, Take 1 capsule (240 mg total) by mouth daily., Disp: 30 capsule, Rfl: 5  Review of Systems  Constitutional: Negative for appetite change, chills and fever.  Respiratory: Negative for chest tightness, shortness of breath and wheezing.   Cardiovascular: Negative for chest pain and palpitations.  Gastrointestinal: Negative for abdominal pain, nausea and vomiting.    Social History    Tobacco Use  . Smoking status: Never Smoker  . Smokeless tobacco: Never Used  Substance Use Topics  . Alcohol use: No   Objective:   There were no vitals taken for this visit. There were no vitals filed for this visit.   Physical Exam      Assessment & Plan:           Mila Merryonald Fisher, MD  [facility]

## 2018-07-13 ENCOUNTER — Ambulatory Visit: Payer: BLUE CROSS/BLUE SHIELD | Admitting: Family Medicine

## 2018-07-13 ENCOUNTER — Encounter: Payer: Self-pay | Admitting: Family Medicine

## 2018-07-13 ENCOUNTER — Ambulatory Visit: Payer: Self-pay | Admitting: Family Medicine

## 2018-07-13 VITALS — BP 122/92 | HR 52 | Temp 97.9°F | Resp 16 | Wt 167.0 lb

## 2018-07-13 DIAGNOSIS — M79674 Pain in right toe(s): Secondary | ICD-10-CM

## 2018-07-13 DIAGNOSIS — I10 Essential (primary) hypertension: Secondary | ICD-10-CM | POA: Diagnosis not present

## 2018-07-13 DIAGNOSIS — I480 Paroxysmal atrial fibrillation: Secondary | ICD-10-CM | POA: Diagnosis not present

## 2018-07-13 NOTE — Progress Notes (Signed)
Patient: Peter Wyatt Male    DOB: May 14, 1966   52 y.o.   MRN: [id number] Visit Date: 07/13/2018  Today's Provider: Mila Merryonald Fisher, MD   Chief Complaint  Patient presents with  . Hypertension  . Atrial Fibrillation   Subjective:    HPI  Hypertension, follow-up:  BP Readings from Last 3 Encounters:  07/13/18 (!) 122/92  04/09/18 (!) 132/92  04/02/18 (!) 103/91    He was last seen for hypertension 3 months ago.  BP at that visit was 132/92. Management since that visit includes no changes. He reports good compliance with treatment. He is not having side effects.  He is exercising. He is adherent to low salt diet.   Outside blood pressures are not checked. He is experiencing none.  Patient denies chest pain, chest pressure/discomfort, claudication, dyspnea, exertional chest pressure/discomfort, fatigue, irregular heart beat, lower extremity edema, near-syncope, orthopnea, palpitations, paroxysmal nocturnal dyspnea, syncope and tachypnea.   Cardiovascular risk factors include hypertension and male gender.  Use of agents associated with hypertension: none.     Weight trend: decreasing steadily Wt Readings from Last 3 Encounters:  07/13/18 167 lb (75.8 kg)  04/09/18 173 lb (78.5 kg)  04/02/18 168 lb 12.8 oz (76.6 kg)    Current diet: well balanced  ------------------------------------------------------------------------ Follow up of Atrial Fibrillation: Patient was last seen for this problem 3 months ago. Management during that visit includes encouraging patient top avoid stimulants like caffeine and oral decongestants. Patient was also advised to avoid excessive alcohol consumption.   He also complains of constant pain in his right great toe for the last year. It does not get red or swollen. Pain is present every day, but worse the more he is on his feet.      No Known Allergies   Current Outpatient Medications:  .  aspirin-acetaminophen-caffeine  (EXCEDRIN MIGRAINE) 250-250-65 MG tablet, Take 1 tablet by mouth every 6 (six) hours as needed for headache., Disp: , Rfl:  .  diltiazem (CARTIA XT) 240 MG 24 hr capsule, Take 1 capsule (240 mg total) by mouth daily., Disp: 30 capsule, Rfl: 5  Review of Systems  Constitutional: Negative for appetite change, chills and fever.  Respiratory: Negative for chest tightness, shortness of breath and wheezing.   Cardiovascular: Negative for chest pain and palpitations.  Gastrointestinal: Negative for abdominal pain, nausea and vomiting.  Neurological: Positive for dizziness and headaches.    Social History   Tobacco Use  . Smoking status: Never Smoker  . Smokeless tobacco: Never Used  Substance Use Topics  . Alcohol use: No   Objective:   BP (!) 122/92 (BP Location: Right Arm, Cuff Size: Large)   Pulse (!) 52   Temp 97.9 F (36.6 C) (Oral)   Resp 16   Wt 167 lb (75.8 kg)   SpO2 96% Comment: room air  BMI 22.65 kg/m  Vitals:   07/13/18 1532 07/13/18 1535  BP: 130/90 (!) 122/92  Pulse: (!) 52   Resp: 16   Temp: 97.9 F (36.6 C)   TempSrc: Oral   SpO2: 96%   Weight: 167 lb (75.8 kg)      Physical Exam   General Appearance:    Alert, cooperative, no distress  Eyes:    PERRL, conjunctiva/corneas clear, EOM's intact       Lungs:     Clear to auscultation bilaterally, respirations unlabored  Heart:    Regular rate and rhythm  MS:   Tender prominence of  distal head of right first metatarsal.       EKG: NSR    Assessment & Plan:     1. Paroxysmal atrial fibrillation (HCC) On NSR since being on diltiazem. Continue current medications.   - EKG 12-Lead  2. Essential hypertension Fairly well controlled. Advised to check home BP about once a week.  - EKG 12-Lead  3. Pain of toe of right foot Not c/w gout. Likely some deformity due to OA. Advised he could call back for podiatry referral at his convenience.        [name], MD  [facility]

## 2018-12-04 ENCOUNTER — Other Ambulatory Visit: Payer: Self-pay | Admitting: Family Medicine

## 2018-12-04 DIAGNOSIS — I480 Paroxysmal atrial fibrillation: Secondary | ICD-10-CM

## 2018-12-04 MED ORDER — DILTIAZEM HCL ER COATED BEADS 240 MG PO CP24: 240.0000 mg | ORAL_CAPSULE | Freq: Every day | ORAL | 5 refills | Status: DC

## 2018-12-04 NOTE — Telephone Encounter (Signed)
Last o.v. was 07/13/2018, please advise.

## 2018-12-04 NOTE — Telephone Encounter (Signed)
Patient is out of Diltiazem 240 mg and [facility] [address]

## 2019-01-13 ENCOUNTER — Encounter: Payer: Self-pay | Admitting: Family Medicine

## 2019-02-08 ENCOUNTER — Encounter: Payer: Self-pay | Admitting: Family Medicine

## 2019-02-08 ENCOUNTER — Ambulatory Visit: Payer: BLUE CROSS/BLUE SHIELD | Admitting: Family Medicine

## 2019-02-08 ENCOUNTER — Ambulatory Visit
Admission: RE | Admit: 2019-02-08 | Discharge: 2019-02-08 | Disposition: A | Discharge status: Home / Self Care | Payer: BLUE CROSS/BLUE SHIELD | Source: Ambulatory Visit | Attending: Family Medicine | Admitting: Family Medicine

## 2019-02-08 ENCOUNTER — Ambulatory Visit
Admission: RE | Admit: 2019-02-08 | Discharge: 2019-02-08 | Disposition: A | Discharge status: Home / Self Care | Payer: BLUE CROSS/BLUE SHIELD | Attending: Family Medicine | Admitting: Family Medicine

## 2019-02-08 VITALS — BP 147/95 | HR 62 | Temp 98.2°F | Ht 71.0 in | Wt 172.0 lb

## 2019-02-08 DIAGNOSIS — I10 Essential (primary) hypertension: Secondary | ICD-10-CM | POA: Diagnosis not present

## 2019-02-08 DIAGNOSIS — G8929 Other chronic pain: Secondary | ICD-10-CM | POA: Diagnosis not present

## 2019-02-08 DIAGNOSIS — M542 Cervicalgia: Secondary | ICD-10-CM | POA: Diagnosis not present

## 2019-02-08 DIAGNOSIS — M47812 Spondylosis without myelopathy or radiculopathy, cervical region: Secondary | ICD-10-CM | POA: Diagnosis not present

## 2019-02-08 DIAGNOSIS — R200 Anesthesia of skin: Secondary | ICD-10-CM | POA: Diagnosis not present

## 2019-02-08 DIAGNOSIS — Z23 Encounter for immunization: Secondary | ICD-10-CM

## 2019-02-08 DIAGNOSIS — Z8679 Personal history of other diseases of the circulatory system: Secondary | ICD-10-CM | POA: Insufficient documentation

## 2019-02-08 DIAGNOSIS — R202 Paresthesia of skin: Secondary | ICD-10-CM

## 2019-02-08 DIAGNOSIS — I480 Paroxysmal atrial fibrillation: Secondary | ICD-10-CM | POA: Insufficient documentation

## 2019-02-08 DIAGNOSIS — M4802 Spinal stenosis, cervical region: Secondary | ICD-10-CM | POA: Diagnosis not present

## 2019-02-08 NOTE — Patient Instructions (Signed)
.   Please review the attached list of medications and notify my office if there are any errors.   . Please bring all of your medications to every appointment so we can make sure that our medication list is the same as yours.   
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
## 2019-02-09 ENCOUNTER — Telehealth: Payer: Self-pay

## 2019-02-09 ENCOUNTER — Other Ambulatory Visit: Payer: Self-pay | Admitting: Family Medicine

## 2019-02-09 DIAGNOSIS — M5030 Other cervical disc degeneration, unspecified cervical region: Secondary | ICD-10-CM

## 2019-02-09 DIAGNOSIS — M542 Cervicalgia: Secondary | ICD-10-CM

## 2019-02-09 DIAGNOSIS — R937 Abnormal findings on diagnostic imaging of other parts of musculoskeletal system: Secondary | ICD-10-CM

## 2019-02-09 NOTE — Progress Notes (Signed)
Ct c 

## 2019-02-09 NOTE — Telephone Encounter (Signed)
Pt advised.   Thanks,   -Laura  

## 2019-02-09 NOTE — Telephone Encounter (Signed)
-----   Message from Malva Limes, MD sent at 02/09/2019  8:08 AM EST ----- Xray shows degenerative disk disease and abnormal C6 vertebrae. Needs CT of cervical spine for further evaluation. Have entered order. Should here from sarah to schedule CT in the next few days.

## 2019-02-17 ENCOUNTER — Ambulatory Visit
Admission: RE | Admit: 2019-02-17 | Discharge: 2019-02-17 | Disposition: A | Discharge status: Home / Self Care | Payer: BLUE CROSS/BLUE SHIELD | Source: Ambulatory Visit | Attending: Family Medicine | Admitting: Family Medicine

## 2019-02-17 ENCOUNTER — Telehealth: Payer: Self-pay

## 2019-02-17 DIAGNOSIS — M542 Cervicalgia: Secondary | ICD-10-CM | POA: Diagnosis not present

## 2019-02-17 DIAGNOSIS — R937 Abnormal findings on diagnostic imaging of other parts of musculoskeletal system: Secondary | ICD-10-CM | POA: Diagnosis not present

## 2019-02-17 NOTE — Telephone Encounter (Signed)
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 These are probably pushing against nerve that come out of spine.  This is usually treated with strong antiinflammatory medications, rarely requires injections or surgery. Recommend he take 12 day prednisone taper and schedule follow up in about 3 weeks.

## 2019-02-17 NOTE — Telephone Encounter (Signed)
Tried calling patient. Left message to call back. 

## 2019-02-19 ENCOUNTER — Telehealth: Payer: Self-pay | Admitting: Family Medicine

## 2019-02-19 DIAGNOSIS — M542 Cervicalgia: Secondary | ICD-10-CM

## 2019-02-19 MED ORDER — PREDNISONE 10 MG (48) PO TBPK: ORAL_TABLET | ORAL | 0 refills | Status: DC

## 2019-02-19 MED ORDER — PREDNISONE 10 MG PO TABS: ORAL_TABLET | ORAL | 0 refills | Status: DC

## 2019-02-19 NOTE — Telephone Encounter (Signed)
Prednisone can sometimes trigger a-fib. But the diltiazem should prevent that from happening. There's not really any other medications that would help with his symptoms. The alternative would be to refer to neurosurgery for possible injections.  I would suggest trying the prednisone first, but change directions to 6 for 2 days, 5 for 2 days, 4 for 2 days, 3 for 2 days, 2 for 2 days and 1 for 2 days (quantity 42)

## 2019-02-19 NOTE — Telephone Encounter (Signed)
Pt returned missed call.  Please call pt back to discuss asap.  Thanks, [employer]

## 2019-02-19 NOTE — Telephone Encounter (Signed)
Pt calling back to check what Dr. Sherrie Mustache advised. Please call pt back before end of business day today.  Thanks, [employer]

## 2019-02-19 NOTE — Telephone Encounter (Signed)
Advised patient as below. Medication

## 2019-02-19 NOTE — Telephone Encounter (Signed)
Dr. Sherrie Mustache, can this make it worse? Please advise. Thanks!

## 2019-02-19 NOTE — Telephone Encounter (Signed)
Advised patient as below. Medication was sent into the pharmacy. Appt was scheduled.  

## 2019-02-19 NOTE — Telephone Encounter (Signed)
Pt called saying he wanted to talk to someone about taking the prednisone that was just prescribed to him and him  having A-Fib.  He said he heard it can make it worse.  His Callback [phone]  Thanks Barth Kirks

## 2019-03-09 ENCOUNTER — Ambulatory Visit (INDEPENDENT_AMBULATORY_CARE_PROVIDER_SITE_OTHER): Payer: BLUE CROSS/BLUE SHIELD | Admitting: Family Medicine

## 2019-03-09 ENCOUNTER — Other Ambulatory Visit: Payer: Self-pay

## 2019-03-09 ENCOUNTER — Encounter: Payer: Self-pay | Admitting: Family Medicine

## 2019-03-09 VITALS — BP 134/90 | HR 61 | Temp 97.9°F | Resp 16 | Wt 175.0 lb

## 2019-03-09 DIAGNOSIS — M5030 Other cervical disc degeneration, unspecified cervical region: Secondary | ICD-10-CM | POA: Insufficient documentation

## 2019-03-09 MED ORDER — NAPROXEN 500 MG PO TABS: 500.0000 mg | ORAL_TABLET | Freq: Two times a day (BID) | ORAL | 3 refills | Status: DC

## 2019-03-09 NOTE — Progress Notes (Signed)
       Patient: Peter Wyatt Male    DOB: October 04, 1966   53 y.o.   MRN: [id number] Visit Date: 03/09/2019  Today's Provider: [name], MD   Chief Complaint  Patient presents with  . Follow-up   Subjective:     HPI  Follow up for Neck pain:  The patient was last seen for this 4 weeks ago. Management during that visit includes ordering CT Cervical spine which showed 6th cervical vertebrae with small bone spurs. Patient was started on 12 day prednisone taper and advised to follow up in 3 weeks.  He reports good compliance with treatment. He feels that condition is Improved. Patient states the numbness in his arms has improved, but he still feels a tingling feeling in his hands but is no longer keeping him up at night. Tingling sensation is worse in the evenings. He also reports that he still has slight pain in his neck. He is not having side effects.    ------------------------------------------------------------------------------------  No Known Allergies   Current Outpatient Medications:  .  aspirin-acetaminophen-caffeine (EXCEDRIN MIGRAINE) 250-250-65 MG tablet, Take 1 tablet by mouth every 6 (six) hours as needed for headache., Disp: , Rfl:  .  diltiazem (CARTIA XT) 240 MG 24 hr capsule, Take 1 capsule (240 mg total) by mouth daily., Disp: 30 capsule, Rfl: 5  Review of Systems  Constitutional: Negative for appetite change, chills and fever.  Respiratory: Negative for chest tightness, shortness of breath and wheezing.   Cardiovascular: Negative for chest pain and palpitations.  Gastrointestinal: Negative for abdominal pain, nausea and vomiting.  Musculoskeletal: Positive for neck pain.  Neurological:       Tingling in hands    Social History   Tobacco Use  . Smoking status: Never Smoker  . Smokeless tobacco: Never Used  Substance Use Topics  . Alcohol use: No      Objective:   BP 134/90 (BP Location: Left Arm, Patient Position: Sitting, Cuff Size: Large)    Pulse 61   Temp 97.9 F (36.6 C) (Oral)   Resp 16   Wt 175 lb (79.4 kg)   SpO2 96% Comment: room air  BMI 24.41 kg/m  Vitals:   03/09/19 0834  BP: 134/90  Pulse: 61  Resp: 16  Temp: 97.9 F (36.6 C)  TempSrc: Oral  SpO2: 96%  Weight: 175 lb (79.4 kg)     Physical Exam  General appearance: alert, well developed, well nourished, cooperative and in no distress Head: Normocephalic, without obvious abnormality, atraumatic Respiratory: Respirations even and unlabored, normal respiratory rate Extremities: No gross deformities Skin: Skin color, texture, turgor normal. No rashes seen  Psych: Appropriate mood and affect. Neurologic: Mental status: Alert, oriented to person, place, and time, thought content appropriate.     Assessment & Plan    1. DDD (degenerative disc disease), cervical Tingling much better since taking 12 day course of prednisone, but not resolved. Will start scheduled NSAIDs for the next few months. Consider gabapentin. He did ask about surgical correction whch he is interested in. Consider neurosurg referral if not controlled with scheduled NSAIDs. Recheck in 3 months     [name], MD  [facility]

## 2019-03-09 NOTE — Patient Instructions (Addendum)
.   Please review the attached list of medications and notify my office if there are any errors.   . Please bring all of your medications to every appointment so we can make sure that our medication list is the same as yours.    If the tingling keeps you up at night we can try a prescription for gabapentin (Neurontin)

## 2019-03-29 ENCOUNTER — Telehealth: Payer: Self-pay

## 2019-03-29 DIAGNOSIS — M5030 Other cervical disc degeneration, unspecified cervical region: Secondary | ICD-10-CM

## 2019-03-29 DIAGNOSIS — R200 Anesthesia of skin: Secondary | ICD-10-CM

## 2019-03-29 DIAGNOSIS — R202 Paresthesia of skin: Secondary | ICD-10-CM

## 2019-03-29 NOTE — Telephone Encounter (Signed)
Patient called saying that he is finished with the prednisone, and he is still having pain in his neck and back. He is wanting to proceed with the referral for the neurosurgeon or ortho. Patient reports that the pain is keeping him up at night. Please review. Thanks!

## 2019-04-01 NOTE — Telephone Encounter (Signed)
Patient is calling office back again to see if you addressed message below. KW

## 2019-04-01 NOTE — Telephone Encounter (Addendum)
See referral note from 03-29-2019. Peter Wyatt has been trying to contact him to arrange referral.

## 2019-04-01 NOTE — Telephone Encounter (Signed)
I spoke with Maralyn Sago in referrals, who states she would try calling patient again.

## 2019-04-06 ENCOUNTER — Telehealth: Payer: Self-pay | Admitting: Family Medicine

## 2019-04-06 DIAGNOSIS — R200 Anesthesia of skin: Secondary | ICD-10-CM | POA: Diagnosis not present

## 2019-04-06 DIAGNOSIS — R202 Paresthesia of skin: Secondary | ICD-10-CM | POA: Diagnosis not present

## 2019-04-06 NOTE — Telephone Encounter (Signed)
He should proceed with nerve conduction studies. This will also help the neurosurgeon determine best treatment options.

## 2019-04-06 NOTE — Telephone Encounter (Signed)
Referral was ordered for pt to be seen by neurosurgery for  M50.30 (ICD-10-CM) - DDD (degenerative disc disease), cervical R20.0,R20.2 (ICD-10-CM) - Numbness and tingling in left arm. Referral was accidentally sent to neurology through proficient.I saw that pt had been scheduled with Dr Sherryll Burger for April 16th and called to cancel appointment but pt done a web visit this morning.I have spoken to pt's wife and advised of mistake and made an apology.She states that Dr Sherryll Burger has ordered nerve conduction studies and wants to know if patient should go forward with this.Referral was resent to neurosurgery today. In proficient the specialities are listed right under each other and it's a click of a button.Please advise

## 2019-04-08 ENCOUNTER — Other Ambulatory Visit: Payer: Self-pay | Admitting: Neurosurgery

## 2019-04-08 DIAGNOSIS — E559 Vitamin D deficiency, unspecified: Secondary | ICD-10-CM | POA: Diagnosis not present

## 2019-04-08 DIAGNOSIS — R200 Anesthesia of skin: Secondary | ICD-10-CM | POA: Diagnosis not present

## 2019-04-08 DIAGNOSIS — M5412 Radiculopathy, cervical region: Secondary | ICD-10-CM

## 2019-04-08 DIAGNOSIS — R202 Paresthesia of skin: Secondary | ICD-10-CM | POA: Diagnosis not present

## 2019-04-08 DIAGNOSIS — E538 Deficiency of other specified B group vitamins: Secondary | ICD-10-CM | POA: Diagnosis not present

## 2019-04-30 ENCOUNTER — Telehealth: Payer: Self-pay | Admitting: *Deleted

## 2019-04-30 DIAGNOSIS — M542 Cervicalgia: Secondary | ICD-10-CM

## 2019-04-30 MED ORDER — PREDNISONE 10 MG PO TABS: ORAL_TABLET | ORAL | 0 refills | Status: DC

## 2019-04-30 NOTE — Telephone Encounter (Signed)
Patient's wife Peter Wyatt called office requesting a prednisone taper. Peter Wyatt states patient has been seeing [facility] but has been unable to get in touch with them. Peter Wyatt states pt has been taking gabapentin with no relief. Patient wants another round of prednisone because this seems to be the only medication he's taken that gives him relief. Peter Wyatt also wanted to know if patient could be prescribed prednisone daily? Please advise? [facility] [place].

## 2019-04-30 NOTE — Telephone Encounter (Signed)
Please review. Thanks!  

## 2019-04-30 NOTE — Telephone Encounter (Signed)
Advised patient's wife as below.  

## 2019-04-30 NOTE — Telephone Encounter (Signed)
Have sent refill for prednisone. Can only take prednisone intermittently. Not safe to take it all the time since it suppresses the immune system.

## 2019-05-31 DIAGNOSIS — R202 Paresthesia of skin: Secondary | ICD-10-CM | POA: Diagnosis not present

## 2019-05-31 DIAGNOSIS — R200 Anesthesia of skin: Secondary | ICD-10-CM | POA: Diagnosis not present

## 2019-06-03 DIAGNOSIS — R29898 Other symptoms and signs involving the musculoskeletal system: Secondary | ICD-10-CM | POA: Diagnosis not present

## 2019-06-03 DIAGNOSIS — R200 Anesthesia of skin: Secondary | ICD-10-CM | POA: Diagnosis not present

## 2019-06-03 DIAGNOSIS — R202 Paresthesia of skin: Secondary | ICD-10-CM | POA: Diagnosis not present

## 2019-06-04 ENCOUNTER — Other Ambulatory Visit: Payer: Self-pay | Admitting: Family Medicine

## 2019-06-04 DIAGNOSIS — I480 Paroxysmal atrial fibrillation: Secondary | ICD-10-CM

## 2019-06-21 ENCOUNTER — Ambulatory Visit: Payer: Self-pay | Admitting: Family Medicine

## 2019-06-29 ENCOUNTER — Ambulatory Visit (INDEPENDENT_AMBULATORY_CARE_PROVIDER_SITE_OTHER): Payer: BC Managed Care – PPO | Admitting: Family Medicine

## 2019-06-29 ENCOUNTER — Other Ambulatory Visit: Payer: Self-pay

## 2019-06-29 ENCOUNTER — Encounter: Payer: Self-pay | Admitting: Family Medicine

## 2019-06-29 VITALS — BP 136/92 | HR 62 | Temp 97.6°F | Resp 16 | Wt 180.0 lb

## 2019-06-29 DIAGNOSIS — Z23 Encounter for immunization: Secondary | ICD-10-CM

## 2019-06-29 DIAGNOSIS — I10 Essential (primary) hypertension: Secondary | ICD-10-CM

## 2019-06-29 DIAGNOSIS — Z125 Encounter for screening for malignant neoplasm of prostate: Secondary | ICD-10-CM

## 2019-06-29 NOTE — Progress Notes (Signed)
Patient: Peter Wyatt Male    DOB: Sep 13, 1966   53 y.o.   MRN: [id number] Visit Date: 06/29/2019  Today's Provider: Lelon Huh, MD   Chief Complaint  Patient presents with  . Hypertension   Subjective:     HPI     Hypertension, follow-up:  BP Readings from Last 3 Encounters:  06/29/19 (!) 151/94  03/09/19 134/90  02/08/19 (!) 147/95    He was last seen for hypertension 6 months ago.  BP at that visit was 147/95. Management since that visit includes no changes. He reports excellent compliance with treatment. He is not having side effects.  He is exercising. He is adherent to low salt diet.   Outside blood pressures are 130's/80' to 140s.  He is experiencing none.  Patient denies chest pain, fatigue, irregular heart beat, lower extremity edema and palpitations.   Cardiovascular risk factors include advanced age (older than 79 for men, 35 for women), hypertension and male gender.  Use of agents associated with hypertension: none.     Weight trend: stable Wt Readings from Last 3 Encounters:  06/29/19 180 lb (81.6 kg)  03/09/19 175 lb (79.4 kg)  02/08/19 172 lb (78 kg)    Current diet: in general, a "healthy" diet    ------------------------------------------------------------------------  Is seeing orthopedics for carpal tunnel. Has been taking ibuprofen a few times a week, but rarely takes naproxen.   No Known Allergies   Current Outpatient Medications:  .  aspirin-acetaminophen-caffeine (EXCEDRIN MIGRAINE) 250-250-65 MG tablet, Take 1 tablet by mouth every 6 (six) hours as needed for headache., Disp: , Rfl:  .  CARTIA XT 240 MG 24 hr capsule, TAKE 1 CAPSULE BY MOUTH EVERY DAY, Disp: 30 capsule, Rfl: 5 .  gabapentin (NEURONTIN) 300 MG capsule, Take by mouth., Disp: , Rfl:  .  naproxen (NAPROSYN) 500 MG tablet, Take 1 tablet (500 mg total) by mouth 2 (two) times daily with a meal., Disp: 60 tablet, Rfl: 3  Review of Systems  Constitutional:  Negative.   Respiratory: Negative.   Cardiovascular: Negative.   Neurological: Negative for dizziness, light-headedness and headaches.    Social History   Tobacco Use  . Smoking status: Never Smoker  . Smokeless tobacco: Never Used  Substance Use Topics  . Alcohol use: No      Objective:   BP (!) 151/94 (BP Location: Right Arm, Patient Position: Sitting, Cuff Size: Normal)   Pulse 64   Temp 97.6 F (36.4 C) (Oral)   Resp 16   Wt 180 lb (81.6 kg)   BMI 25.10 kg/m  Vitals:   06/29/19 0830 06/29/19 0847  BP: (!) 151/94 (!) 136/92  Pulse: 64 62  Resp: 16   Temp: 97.6 F (36.4 C)   TempSrc: Oral   Weight: 180 lb (81.6 kg)      Physical Exam   General appearance: alert, well developed, well nourished, cooperative and in no distress Head: Normocephalic, without obvious abnormality, atraumatic Respiratory: Respirations even and unlabored, normal respiratory rate Extremities: No gross deformities Skin: Skin color, texture, turgor normal. No rashes seen  Psych: Appropriate mood and affect. Neurologic: Mental status: Alert, oriented to person, place, and time, thought content appropriate.      Assessment & Plan    1. Essential hypertension Improved, he has not been following low salt diet as well and not been exercising as much as normal. Continue current medications.  Work on [employer] and exercise.  - Comprehensive metabolic  panel - Lipid panel  2. Prostate cancer screening  - PSA  3. Need for shingles vaccine  - Varicella-zoster vaccine IM (Shingrix) #2   Mila Merryonald Johanthan Kneeland, MD  [facility]

## 2019-06-29 NOTE — Patient Instructions (Addendum)
. Please review the attached list of medications and notify my office if there are any errors.   . Please bring all of your medications to every appointment so we can make sure that our medication list is the same as yours.   . We will have flu vaccines available after Labor Day. Please go to your pharmacy or call the office in early September to schedule you flu shot.   DASH Eating Plan DASH stands for "Dietary Approaches to Stop Hypertension." The DASH eating plan is a healthy eating plan that has been shown to reduce high blood pressure (hypertension). It may also reduce your risk for type 2 diabetes, heart disease, and stroke. The DASH eating plan may also help with weight loss. What are tips for following this plan?  General guidelines  Avoid eating more than 2,300 mg (milligrams) of salt (sodium) a day. If you have hypertension, you may need to reduce your sodium intake to 1,500 mg a day.  Limit alcohol intake to no more than 1 drink a day for nonpregnant women and 2 drinks a day for men. One drink equals 12 oz of beer, 5 oz of wine, or 1 oz of hard liquor.  Work with your health care provider to maintain a healthy body weight or to lose weight. Ask what an ideal weight is for you.  Get at least 30 minutes of exercise that causes your heart to beat faster (aerobic exercise) most days of the week. Activities may include walking, swimming, or biking.  Work with your health care provider or diet and nutrition specialist (dietitian) to adjust your eating plan to your individual calorie needs. Reading food labels   Check food labels for the amount of sodium per serving. Choose foods with less than 5 percent of the Daily Value of sodium. Generally, foods with less than 300 mg of sodium per serving fit into this eating plan.  To find whole grains, look for the word "whole" as the first word in the ingredient list. Shopping  Buy products labeled as "low-sodium" or "no salt added."  Buy  fresh foods. Avoid canned foods and premade or frozen meals. Cooking  Avoid adding salt when cooking. Use salt-free seasonings or herbs instead of table salt or sea salt. Check with your health care provider or pharmacist before using salt substitutes.  Do not fry foods. Cook foods using healthy methods such as baking, boiling, grilling, and broiling instead.  Cook with heart-healthy oils, such as olive, canola, soybean, or sunflower oil. Meal planning  Eat a balanced diet that includes: ? 5 or more servings of fruits and vegetables each day. At each meal, try to fill half of your plate with fruits and vegetables. ? Up to 6-8 servings of whole grains each day. ? Less than 6 oz of lean meat, poultry, or fish each day. A 3-oz serving of meat is about the same size as a deck of cards. One egg equals 1 oz. ? 2 servings of low-fat dairy each day. ? A serving of nuts, seeds, or beans 5 times each week. ? Heart-healthy fats. Healthy fats called Omega-3 fatty acids are found in foods such as flaxseeds and coldwater fish, like sardines, salmon, and mackerel.  Limit how much you eat of the following: ? Canned or prepackaged foods. ? Food that is high in trans fat, such as fried foods. ? Food that is high in saturated fat, such as fatty meat. ? Sweets, desserts, sugary drinks, and other foods with   added sugar. ? Full-fat dairy products.  Do not salt foods before eating.  Try to eat at least 2 vegetarian meals each week.  Eat more home-cooked food and less restaurant, buffet, and fast food.  When eating at a restaurant, ask that your food be prepared with less salt or no salt, if possible. What foods are recommended? The items listed may not be a complete list. Talk with your dietitian about what dietary choices are best for you. Grains Whole-grain or whole-wheat bread. Whole-grain or whole-wheat pasta. Brown rice. Oatmeal. Quinoa. Bulgur. Whole-grain and low-sodium cereals. Pita bread.  Low-fat, low-sodium crackers. Whole-wheat flour tortillas. Vegetables Fresh or frozen vegetables (raw, steamed, roasted, or grilled). Low-sodium or reduced-sodium tomato and vegetable juice. Low-sodium or reduced-sodium tomato sauce and tomato paste. Low-sodium or reduced-sodium canned vegetables. Fruits All fresh, dried, or frozen fruit. Canned fruit in natural juice (without added sugar). Meat and other protein foods Skinless chicken or turkey. Ground chicken or turkey. Pork with fat trimmed off. Fish and seafood. Egg whites. Dried beans, peas, or lentils. Unsalted nuts, nut butters, and seeds. Unsalted canned beans. Lean cuts of beef with fat trimmed off. Low-sodium, lean deli meat. Dairy Low-fat (1%) or fat-free (skim) milk. Fat-free, low-fat, or reduced-fat cheeses. Nonfat, low-sodium ricotta or cottage cheese. Low-fat or nonfat yogurt. Low-fat, low-sodium cheese. Fats and oils Soft margarine without trans fats. Vegetable oil. Low-fat, reduced-fat, or light mayonnaise and salad dressings (reduced-sodium). Canola, safflower, olive, soybean, and sunflower oils. Avocado. Seasoning and other foods Herbs. Spices. Seasoning mixes without salt. Unsalted popcorn and pretzels. Fat-free sweets. What foods are not recommended? The items listed may not be a complete list. Talk with your dietitian about what dietary choices are best for you. Grains Baked goods made with fat, such as croissants, muffins, or some breads. Dry pasta or rice meal packs. Vegetables Creamed or fried vegetables. Vegetables in a cheese sauce. Regular canned vegetables (not low-sodium or reduced-sodium). Regular canned tomato sauce and paste (not low-sodium or reduced-sodium). Regular tomato and vegetable juice (not low-sodium or reduced-sodium). Pickles. Olives. Fruits Canned fruit in a light or heavy syrup. Fried fruit. Fruit in cream or butter sauce. Meat and other protein foods Fatty cuts of meat. Ribs. Fried meat. Bacon.  Sausage. Bologna and other processed lunch meats. Salami. Fatback. Hotdogs. Bratwurst. Salted nuts and seeds. Canned beans with added salt. Canned or smoked fish. Whole eggs or egg yolks. Chicken or turkey with skin. Dairy Whole or 2% milk, cream, and half-and-half. Whole or full-fat cream cheese. Whole-fat or sweetened yogurt. Full-fat cheese. Nondairy creamers. Whipped toppings. Processed cheese and cheese spreads. Fats and oils Butter. Stick margarine. Lard. Shortening. Ghee. Bacon fat. Tropical oils, such as coconut, palm kernel, or palm oil. Seasoning and other foods Salted popcorn and pretzels. Onion salt, garlic salt, seasoned salt, table salt, and sea salt. Worcestershire sauce. Tartar sauce. Barbecue sauce. Teriyaki sauce. Soy sauce, including reduced-sodium. Steak sauce. Canned and packaged gravies. Fish sauce. Oyster sauce. Cocktail sauce. Horseradish that you find on the shelf. Ketchup. Mustard. Meat flavorings and tenderizers. Bouillon cubes. Hot sauce and Tabasco sauce. Premade or packaged marinades. Premade or packaged taco seasonings. Relishes. Regular salad dressings. Where to find more information:  National Heart, Lung, and Blood Institute: www.nhlbi.nih.gov  American Heart Association: www.heart.org Summary  The DASH eating plan is a healthy eating plan that has been shown to reduce high blood pressure (hypertension). It may also reduce your risk for type 2 diabetes, heart disease, and stroke.  With the DASH   eating plan, you should limit salt (sodium) intake to 2,300 mg a day. If you have hypertension, you may need to reduce your sodium intake to 1,500 mg a day.  When on the DASH eating plan, aim to eat more fresh fruits and vegetables, whole grains, lean proteins, low-fat dairy, and heart-healthy fats.  Work with your health care provider or diet and nutrition specialist (dietitian) to adjust your eating plan to your individual calorie needs. This information is not intended  to replace advice given to you by your health care provider. Make sure you discuss any questions you have with your health care provider. Document Released: 11/28/2011 Document Revised: 11/21/2017 Document Reviewed: 12/02/2016 Elsevier Patient Education  2020 Elsevier Inc.  

## 2019-06-30 ENCOUNTER — Telehealth: Payer: Self-pay

## 2019-06-30 LAB — LIPID PANEL
Chol/HDL Ratio: 2.3 ratio (ref 0.0–5.0)
Cholesterol, Total: 130 mg/dL (ref 100–199)
HDL: 57 mg/dL (ref >39)
LDL Calculated: 61 mg/dL (ref 0–99)
Triglycerides: 59 mg/dL (ref 0–149)
VLDL Cholesterol Cal: 12 mg/dL (ref 5–40)

## 2019-06-30 LAB — COMPREHENSIVE METABOLIC PANEL
ALT: 21 IU/L (ref 0–44)
AST: 22 IU/L (ref 0–40)
Albumin/Globulin Ratio: 2.6 — ABNORMAL HIGH (ref 1.2–2.2)
Albumin: 4.5 g/dL (ref 3.8–4.9)
Alkaline Phosphatase: 80 IU/L (ref 39–117)
BUN/Creatinine Ratio: 21 — ABNORMAL HIGH (ref 9–20)
BUN: 18 mg/dL (ref 6–24)
Bilirubin Total: 0.8 mg/dL (ref 0.0–1.2)
CO2: 21 mmol/L (ref 20–29)
Calcium: 9.1 mg/dL (ref 8.7–10.2)
Chloride: 106 mmol/L (ref 96–106)
Creatinine, Ser: 0.86 mg/dL (ref 0.76–1.27)
GFR calc Af Amer: 116 mL/min/{1.73_m2} (ref >59)
GFR calc non Af Amer: 100 mL/min/{1.73_m2} (ref >59)
Globulin, Total: 1.7 g/dL (ref 1.5–4.5)
Glucose: 80 mg/dL (ref 65–99)
Potassium: 3.6 mmol/L (ref 3.5–5.2)
Sodium: 142 mmol/L (ref 134–144)
Total Protein: 6.2 g/dL (ref 6.0–8.5)

## 2019-06-30 LAB — PSA: Prostate Specific Ag, Serum: 0.5 ng/mL (ref 0.0–4.0)

## 2019-06-30 NOTE — Telephone Encounter (Signed)
I called patient to advise him of results. I was unable to clearly hear patient due to excessive noise in his background. Patient agreed to call back for results once he was in a quite location.

## 2019-06-30 NOTE — Telephone Encounter (Signed)
-----   Message from Birdie Sons, MD sent at 06/30/2019  7:30 AM EDT ----- Cholesterol is very good at 130, normal sugar, kidneys, liver functions, and PSA. Check labs yearly.

## 2019-06-30 NOTE — Telephone Encounter (Signed)
Patient advised.

## 2019-07-12 DIAGNOSIS — G5601 Carpal tunnel syndrome, right upper limb: Secondary | ICD-10-CM | POA: Diagnosis not present

## 2019-07-19 DIAGNOSIS — M79671 Pain in right foot: Secondary | ICD-10-CM | POA: Diagnosis not present

## 2019-07-19 DIAGNOSIS — G5601 Carpal tunnel syndrome, right upper limb: Secondary | ICD-10-CM | POA: Diagnosis not present

## 2019-07-19 DIAGNOSIS — M79642 Pain in left hand: Secondary | ICD-10-CM | POA: Diagnosis not present

## 2019-07-19 DIAGNOSIS — M8949 Other hypertrophic osteoarthropathy, multiple sites: Secondary | ICD-10-CM | POA: Diagnosis not present

## 2019-07-19 DIAGNOSIS — M79641 Pain in right hand: Secondary | ICD-10-CM | POA: Diagnosis not present

## 2019-08-10 ENCOUNTER — Ambulatory Visit: Payer: BLUE CROSS/BLUE SHIELD | Admitting: Family Medicine

## 2019-10-01 ENCOUNTER — Other Ambulatory Visit: Payer: Self-pay

## 2019-10-01 ENCOUNTER — Ambulatory Visit: Payer: BC Managed Care – PPO | Admitting: Family Medicine

## 2019-10-01 ENCOUNTER — Encounter: Payer: Self-pay | Admitting: Family Medicine

## 2019-10-01 VITALS — BP 161/93 | HR 60 | Temp 96.8°F | Resp 16 | Wt 174.2 lb

## 2019-10-01 DIAGNOSIS — Z23 Encounter for immunization: Secondary | ICD-10-CM | POA: Diagnosis not present

## 2019-10-01 DIAGNOSIS — I10 Essential (primary) hypertension: Secondary | ICD-10-CM | POA: Diagnosis not present

## 2019-10-01 DIAGNOSIS — M79675 Pain in left toe(s): Secondary | ICD-10-CM

## 2019-10-01 DIAGNOSIS — F418 Other specified anxiety disorders: Secondary | ICD-10-CM | POA: Diagnosis not present

## 2019-10-01 MED ORDER — BUSPIRONE HCL 15 MG PO TABS: ORAL_TABLET | ORAL | 1 refills | Status: DC

## 2019-10-01 NOTE — Patient Instructions (Addendum)
.   Please review the attached list of medications and notify my office if there are any errors.   . Please bring all of your medications to every appointment so we can make sure that our medication list is the same as yours.   

## 2019-10-01 NOTE — Progress Notes (Signed)
Patient: Peter Wyatt Male    DOB: 04/02/1966   53 y.o.   MRN: [id number] Visit Date: 10/01/2019  Today's Provider: [name], MD   Chief Complaint  Patient presents with   Hypertension   Anxiety   Toe Injury   Subjective:    Anxiety Presents for initial visit. Symptoms include dizziness, dry mouth, excessive worry, insomnia, muscle tension, nervous/anxious behavior, obsessions, palpitations, restlessness and shortness of breath. Patient reports no chest pain, compulsions, confusion, decreased concentration, depressed mood, feeling of choking, hyperventilation, impotence, irritability, malaise, nausea, panic or suicidal ideas. The symptoms are aggravated by work stress and family issues (patient states that his father is very ill). The quality of sleep is poor. Nighttime awakenings: several.    Risk factors include family history.  Toe Pain  The incident occurred at home (ottoman in living room). The injury mechanism was a direct blow. The pain is present in the right foot (right pinky toe). The pain has been intermittent since onset. Associated symptoms include numbness and tingling. Pertinent negatives include no inability to bear weight, loss of motion, loss of sensation or muscle weakness. He reports no foreign bodies present. The symptoms are aggravated by weight bearing. He has tried acetaminophen for the symptoms. The treatment provided no relief. He states he has broken same two at least twice in the past. States that when he injured it this time it was crooked and he popped it back into place. It has improved, but is still swollen.    Hypertension, follow-up:  BP Readings from Last 3 Encounters:  10/01/19 (!) 161/93  06/29/19 (!) 136/92  03/09/19 134/90    He was last seen for hypertension 3 months ago.  BP at that visit was 136/92. Management changes since that visit include none. He reports excellent compliance with treatment. He is not having side  effects.  He is not exercising. He is not adherent to low salt diet.   Outside blood pressures are not being checked regularly. He is experiencing chest pressure/discomfort, fatigue and lightheadiness.  Patient denies chest pain, claudication, dyspnea, exertional chest pressure/discomfort, fatigue, irregular heart beat, lower extremity edema, near-syncope, palpitations, paroxysmal nocturnal dyspnea, syncope and tachypnea.   Cardiovascular risk factors include advanced age (older than 3 for men, 30 for women), hypertension and male gender.  Use of agents associated with hypertension: none.     Weight trend: decreasing steadily Wt Readings from Last 3 Encounters:  10/01/19 174 lb 3.2 oz (79 kg)  06/29/19 180 lb (81.6 kg)  03/09/19 175 lb (79.4 kg)    Current diet: in general, an "unhealthy" diet  ------------------------------------------------------------------------  No Known Allergies   Current Outpatient Medications:    CARTIA XT 240 MG 24 hr capsule, TAKE 1 CAPSULE BY MOUTH EVERY DAY, Disp: 30 capsule, Rfl: 5   diclofenac sodium (VOLTAREN) 1 % GEL, Apply topically., Disp: , Rfl:    aspirin-acetaminophen-caffeine (EXCEDRIN MIGRAINE) 250-250-65 MG tablet, Take 1 tablet by mouth every 6 (six) hours as needed for headache., Disp: , Rfl:    gabapentin (NEURONTIN) 300 MG capsule, Take by mouth., Disp: , Rfl:    naproxen (NAPROSYN) 500 MG tablet, Take 1 tablet (500 mg total) by mouth 2 (two) times daily with a meal., Disp: 60 tablet, Rfl: 3  Review of Systems  Constitutional: Negative for irritability.  Respiratory: Positive for shortness of breath.   Cardiovascular: Positive for palpitations. Negative for chest pain.  Gastrointestinal: Negative for nausea.  Genitourinary: Negative for  impotence.  Neurological: Positive for dizziness, tingling and numbness.  Psychiatric/Behavioral: Negative for confusion, decreased concentration and suicidal ideas. The patient is  nervous/anxious and has insomnia.     Social History   Tobacco Use   Smoking status: Never Smoker   Smokeless tobacco: Never Used  Substance Use Topics   Alcohol use: No      Objective:   BP (!) 161/93    Pulse 60    Temp (!) 96.8 F (36 C) (Oral)    Resp 16    Wt 174 lb 3.2 oz (79 kg)    BMI 24.30 kg/m  Vitals:   10/01/19 1042 10/01/19 1058  BP: (!) 146/103 (!) 161/93  Pulse: 60   Resp: 16   Temp: (!) 96.8 F (36 C)   TempSrc: Oral   Weight: 174 lb 3.2 oz (79 kg)   Body mass index is 24.3 kg/m.   Physical Exam  General appearance: Well developed, well nourished male, cooperative and in no acute distress Head: Normocephalic, without obvious abnormality, atraumatic Respiratory: Respirations even and unlabored, normal respiratory rate Extremities: Left 5th toe moderately swollen, but straight with no erythema.  Psych: Appropriate mood and affect. Neurologic: Mental status: Alert, oriented to person, place, and time, thought content appropriate.       Assessment & Plan    1. Situational anxiety start - busPIRone (BUSPAR) 15 MG tablet; Start 1/2 tablet twice a day for three days, then 1/2 tablet in morning and 1 at night for three days, then 1 tablet twice daily  Dispense: 60 tablet; Refill: 1  2. Essential hypertension Elevated secondary to anxiety and no longer exercising regularly.   3. Great toe pain, left By history sounds like he dislocated toe and is no in alignment. Advised swelling may wax and wane for several more weeks.   4. Need for influenza vaccination  - Flu Vaccine QUAD 36+ mos IM  The entirety of the information documented in the History of Present Illness, Review of Systems and Physical Exam were personally obtained by me. Portions of this information were initially documented by Minette Headland, CMA and reviewed by me for thoroughness and accuracy.      Lelon Huh, MD  [facility]

## 2019-11-01 ENCOUNTER — Ambulatory Visit: Payer: BC Managed Care – PPO | Admitting: Family Medicine

## 2019-12-01 ENCOUNTER — Other Ambulatory Visit: Payer: Self-pay | Admitting: Family Medicine

## 2019-12-01 DIAGNOSIS — I480 Paroxysmal atrial fibrillation: Secondary | ICD-10-CM

## 2019-12-03 ENCOUNTER — Other Ambulatory Visit: Payer: Self-pay | Admitting: Family Medicine

## 2019-12-03 DIAGNOSIS — F418 Other specified anxiety disorders: Secondary | ICD-10-CM

## 2020-01-04 ENCOUNTER — Other Ambulatory Visit: Payer: Self-pay | Admitting: Family Medicine

## 2020-01-04 DIAGNOSIS — F418 Other specified anxiety disorders: Secondary | ICD-10-CM

## 2020-01-21 ENCOUNTER — Telehealth: Payer: Self-pay

## 2020-01-21 NOTE — Telephone Encounter (Signed)
Patient has an appointment scheduled for 02/15/2020 at 8am. He would like to be seen sooner. 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 We are all booked up and the only slots open are same day slots. Patient wants to know if Dr. Sherrie Mustache could work him in to be seen sooner. Please advise.

## 2020-01-21 NOTE — Telephone Encounter (Signed)
Appointment scheduled. Patient advised.  

## 2020-01-21 NOTE — Telephone Encounter (Signed)
He can have one of the Tuesday 8:40 same day appointments

## 2020-01-21 NOTE — Telephone Encounter (Signed)
Copied from CRM [id number]. Topic: Appointment Scheduling - Scheduling Inquiry for Clinic >> Jan 21, 2020 11:18 AM Fanny Bien wrote: Reason for CRM:pt called and state that he would like to schedule a follow up with Dr Sherrie Mustache would like to know if he can be worked in sooner. Please advise

## 2020-01-25 ENCOUNTER — Ambulatory Visit: Payer: BC Managed Care – PPO | Admitting: Family Medicine

## 2020-01-25 ENCOUNTER — Other Ambulatory Visit: Payer: Self-pay

## 2020-01-25 VITALS — BP 130/84 | HR 66 | Temp 97.1°F | Wt 172.0 lb

## 2020-01-25 DIAGNOSIS — I10 Essential (primary) hypertension: Secondary | ICD-10-CM

## 2020-01-25 DIAGNOSIS — R361 Hematospermia: Secondary | ICD-10-CM | POA: Diagnosis not present

## 2020-01-25 DIAGNOSIS — F418 Other specified anxiety disorders: Secondary | ICD-10-CM

## 2020-01-25 MED ORDER — BUSPIRONE HCL 15 MG PO TABS: 15.0000 mg | ORAL_TABLET | Freq: Two times a day (BID) | ORAL | 1 refills | Status: DC

## 2020-01-25 NOTE — Progress Notes (Signed)
Patient: Peter Wyatt Male    DOB: 10-01-1966   54 y.o.   MRN: [id number] Visit Date: 01/25/2020  Today's Provider: Lelon Huh, MD   Chief Complaint  Patient presents with  . Hypertension  . Anxiety   Subjective:    HPI   The patient is a 54 year old male whop presents for follow up of chronic health issues.  He would also like to have his prostate checked today.  He was last seen in 09/2019.  Hypertension The patient's reading on the last visit was elevated and thought to be secondary to his increased anxiety and decreased exercise.  He is not checking his blood pressure outside the office.  He is currently on Aspirin therapy and Diltiazem.  His last renal check was in July 2020.  He is tolerating his medications  Anxiety The patient had increased anxiety on his last visit and was started on Buspirone.  He is tolerating the medication and reports that his anxiety is better.  He feels this dose is good for him and wishes to continue unchanged.   Prostate The patient would like to have his prostate checked today.  He did have PSA in July 2020 and it was 0.5. He states he has been having recurrent episodes of blood in semen. It does not appear everytime, but does occur several times each month.    No Known Allergies   Current Outpatient Medications:  .  aspirin-acetaminophen-caffeine (EXCEDRIN MIGRAINE) 250-250-65 MG tablet, Take 1 tablet by mouth every 6 (six) hours as needed for headache., Disp: , Rfl:  .  busPIRone (BUSPAR) 15 MG tablet, TAKE 1/2 (ONE-HALF) TABLET BY MOUTH TWICE DAILY FOR 3 DAYS, THEN 1/2 IN THE MORNING AND 1 AT NIGHT FOR 3 DAYS, THEN 1 TABLET TWICE DAILY., Disp: 60 tablet, Rfl: 0 .  diclofenac sodium (VOLTAREN) 1 % GEL, Apply topically., Disp: , Rfl:  .  diltiazem (CARDIZEM CD) 240 MG 24 hr capsule, TAKE ONE CAPSULE BY MOUTH EVERY DAY, Disp: 30 capsule, Rfl: 5 .  gabapentin (NEURONTIN) 300 MG capsule, Take 300 mg by mouth at bedtime. , Disp: , Rfl:   .  naproxen (NAPROSYN) 500 MG tablet, Take 1 tablet (500 mg total) by mouth 2 (two) times daily with a meal. (Patient not taking: Reported on 01/25/2020), Disp: 60 tablet, Rfl: 3  Review of Systems  Constitutional: Negative for chills, fatigue and fever.  HENT: Negative for congestion, sinus pain and sore throat.   Respiratory: Negative for cough, chest tightness and shortness of breath.   Cardiovascular: Negative for chest pain, palpitations and leg swelling.  Gastrointestinal: Negative for abdominal pain and diarrhea.  Genitourinary:       Nocturia  Musculoskeletal: Negative for myalgias.  Neurological: Positive for numbness (in hands, sees Dr Manuella Ghazi). Negative for dizziness and headaches.    Social History   Tobacco Use  . Smoking status: Never Smoker  . Smokeless tobacco: Never Used  Substance Use Topics  . Alcohol use: No      Objective:   BP 130/84 (BP Location: Right Arm, Patient Position: Sitting, Cuff Size: Normal)   Pulse 66   Temp (!) 97.1 F (36.2 C) (Skin)   Wt 172 lb (78 kg)   SpO2 98%   BMI 23.99 kg/m  Vitals:   01/25/20 0834  BP: 130/84  Pulse: 66  Temp: (!) 97.1 F (36.2 C)  TempSrc: Skin  SpO2: 98%  Weight: 172 lb (78 kg)  Body  mass index is 23.99 kg/m.   Physical Exam  General appearance: Well developed, well nourished male, cooperative and in no acute distress Head: Normocephalic, without obvious abnormality, atraumatic Respiratory: Respirations even and unlabored, normal respiratory rate Extremities: All extremities are intact.  Skin: Skin color, texture, turgor normal. No rashes seen  Psych: Appropriate mood and affect. Neurologic: Mental status: Alert, oriented to person, place, and time, thought content appropriate.      Assessment & Plan    1. Hemospermia Recurrent for several months. Normal PSA in July 2020.  - Ambulatory referral to Urology  2. Situational anxiety Doing well with current dose of buspirone which is refilled  today.  - busPIRone (BUSPAR) 15 MG tablet; Take 1 tablet (15 mg total) by mouth 2 (two) times daily.  Dispense: 180 tablet; Refill: 1  3. Essential hypertension Well controlled.        [name], MD  [facility]

## 2020-02-10 DIAGNOSIS — I771 Stricture of artery: Secondary | ICD-10-CM | POA: Diagnosis not present

## 2020-02-10 DIAGNOSIS — R9431 Abnormal electrocardiogram [ECG] [EKG]: Secondary | ICD-10-CM | POA: Diagnosis not present

## 2020-02-10 DIAGNOSIS — I517 Cardiomegaly: Secondary | ICD-10-CM | POA: Diagnosis not present

## 2020-02-10 DIAGNOSIS — R0789 Other chest pain: Secondary | ICD-10-CM | POA: Diagnosis not present

## 2020-02-10 DIAGNOSIS — I77819 Aortic ectasia, unspecified site: Secondary | ICD-10-CM | POA: Diagnosis not present

## 2020-02-10 DIAGNOSIS — Z79899 Other long term (current) drug therapy: Secondary | ICD-10-CM | POA: Diagnosis not present

## 2020-02-10 DIAGNOSIS — I4891 Unspecified atrial fibrillation: Secondary | ICD-10-CM | POA: Diagnosis not present

## 2020-02-10 DIAGNOSIS — R0609 Other forms of dyspnea: Secondary | ICD-10-CM | POA: Diagnosis not present

## 2020-02-10 DIAGNOSIS — R002 Palpitations: Secondary | ICD-10-CM | POA: Diagnosis not present

## 2020-02-10 DIAGNOSIS — E876 Hypokalemia: Secondary | ICD-10-CM | POA: Diagnosis not present

## 2020-02-10 DIAGNOSIS — Z20822 Contact with and (suspected) exposure to covid-19: Secondary | ICD-10-CM | POA: Diagnosis not present

## 2020-02-14 ENCOUNTER — Telehealth: Payer: Self-pay

## 2020-02-14 NOTE — Telephone Encounter (Signed)
Does not have to be weaned. He can stop. We could always try lower dose if he likes which would likely have fewer side effects.

## 2020-02-14 NOTE — Telephone Encounter (Signed)
Patient's wife advised. She states patient prefers to stop medication.

## 2020-02-14 NOTE — Telephone Encounter (Signed)
Copied from CRM [id number]. Topic: General - Inquiry >> Feb 14, 2020  9:53 AM Reggie Pile, NT wrote: Reason for CRM: Patient's wife called in stating pt is wanting to stop taking gabapentin (NEURONTIN) 300 MG capsule. Patient's wife states he believes he is having some side effects from it. Pt's wife did not go into detail but pt would like to know if he could completely stop or be weaned off. Please advise.

## 2020-02-15 ENCOUNTER — Ambulatory Visit: Payer: BC Managed Care – PPO | Admitting: Family Medicine

## 2020-02-21 DIAGNOSIS — Z8679 Personal history of other diseases of the circulatory system: Secondary | ICD-10-CM | POA: Diagnosis not present

## 2020-02-21 DIAGNOSIS — I10 Essential (primary) hypertension: Secondary | ICD-10-CM | POA: Diagnosis not present

## 2020-02-21 DIAGNOSIS — I4819 Other persistent atrial fibrillation: Secondary | ICD-10-CM | POA: Diagnosis not present

## 2020-02-21 DIAGNOSIS — R002 Palpitations: Secondary | ICD-10-CM | POA: Diagnosis not present

## 2020-02-21 DIAGNOSIS — E876 Hypokalemia: Secondary | ICD-10-CM | POA: Diagnosis not present

## 2020-02-22 DIAGNOSIS — R002 Palpitations: Secondary | ICD-10-CM | POA: Diagnosis not present

## 2020-02-22 DIAGNOSIS — I4819 Other persistent atrial fibrillation: Secondary | ICD-10-CM | POA: Diagnosis not present

## 2020-02-25 ENCOUNTER — Encounter: Payer: Self-pay | Admitting: Urology

## 2020-02-25 ENCOUNTER — Other Ambulatory Visit: Payer: Self-pay

## 2020-02-25 ENCOUNTER — Ambulatory Visit: Payer: BC Managed Care – PPO | Admitting: Urology

## 2020-02-25 VITALS — BP 154/101 | HR 73 | Ht 71.0 in | Wt 166.0 lb

## 2020-02-25 DIAGNOSIS — R361 Hematospermia: Secondary | ICD-10-CM | POA: Diagnosis not present

## 2020-02-25 DIAGNOSIS — N400 Enlarged prostate without lower urinary tract symptoms: Secondary | ICD-10-CM

## 2020-02-25 LAB — URINALYSIS, COMPLETE
Bilirubin, UA: NEGATIVE
Glucose, UA: NEGATIVE
Ketones, UA: NEGATIVE
Leukocytes,UA: NEGATIVE
Nitrite, UA: NEGATIVE
Protein,UA: NEGATIVE
Specific Gravity, UA: 1.02 (ref 1.005–1.030)
Urobilinogen, Ur: 0.2 mg/dL (ref 0.2–1.0)
pH, UA: 7 (ref 5.0–7.5)

## 2020-02-25 LAB — MICROSCOPIC EXAMINATION: RBC, Urine: NONE SEEN /hpf (ref 0–2)

## 2020-02-25 NOTE — Progress Notes (Signed)
02/25/2020 9:41 AM   Peter Wyatt [birth year] [id number]  Referring provider: Malva Limes, MD [address] [place],  [place] [address]  No chief complaint on file.   HPI:  Mr. Conant was sent over for hematospermia. He first noticed it a few months ago. It was red and then rusty and brown.   His PSA was 0.5 on 06/29/2019.  He usually has a good flow and doesn't strain. No constipation. He drinks a lot of water. No gross hematuria.   PMH: No past medical history on file.  Surgical History: No past surgical history on file.  Home Medications:  Allergies as of 02/25/2020   No Known Allergies     Medication List       Accurate as of February 25, 2020  9:41 AM. If you have any questions, ask your nurse or doctor.        STOP taking these medications   gabapentin 300 MG capsule Commonly known as: NEURONTIN Stopped by: Jerilee Field, MD   naproxen 500 MG tablet Commonly known as: Naprosyn Stopped by: Jerilee Field, MD     TAKE these medications   aspirin-acetaminophen-caffeine [phone] MG tablet Commonly known as: EXCEDRIN MIGRAINE Take 1 tablet by mouth every 6 (six) hours as needed for headache.   busPIRone 15 MG tablet Commonly known as: BUSPAR Take 1 tablet (15 mg total) by mouth 2 (two) times daily.   diclofenac sodium 1 % Gel Commonly known as: VOLTAREN Apply topically.   diltiazem 240 MG 24 hr capsule Commonly known as: CARDIZEM CD TAKE ONE CAPSULE BY MOUTH EVERY DAY   potassium chloride SA 20 MEQ tablet Commonly known as: KLOR-CON Take 20 mEq by mouth 2 (two) times daily.       Allergies: No Known Allergies  Family History: Family History  Problem Relation Age of Onset  . Hypertension Father   . Prostate cancer Father   . Prostate cancer Paternal Grandfather     Social History:  reports that he has never smoked. He has never used smokeless tobacco. He reports that he does not drink alcohol or use  drugs.   Physical Exam: BP (!) 154/101   Pulse 73   Ht 5\' 11"  (1.803 m)   Wt 166 lb (75.3 kg)   BMI 23.15 kg/m   Constitutional:  Alert and oriented, No acute distress. HEENT: [place] AT, moist mucus membranes.  Trachea midline, no masses. Cardiovascular: No clubbing, cyanosis, or edema. Respiratory: Normal respiratory effort, no increased work of breathing. GI: Abdomen is soft, nontender, nondistended, no abdominal masses GU: No CVA tenderness Lymph: No cervical or inguinal lymphadenopathy. Skin: No rashes, bruises or suspicious lesions. Neurologic: Grossly intact, no focal deficits, moving all 4 extremities. Psychiatric: Normal mood and affect. GU: Penis circumcised, normal foreskin, testicles descended bilaterally and palpably normal, bilateral epididymis palpably normal, scrotum normal DRE: Prostate 50, g, smooth without hard area or nodule  Laboratory Data: Lab Results  Component Value Date   WBC 6.3 04/02/2018   HGB 16.0 04/02/2018   HCT 46.2 04/02/2018   MCV 86.8 04/02/2018   PLT 163 04/02/2018    Lab Results  Component Value Date   CREATININE 0.86 06/29/2019    No results found for: PSA  No results found for: TESTOSTERONE  No results found for: HGBA1C  Urinalysis No results found for: COLORURINE, APPEARANCEUR, LABSPEC, PHURINE, GLUCOSEU, HGBUR, BILIRUBINUR, KETONESUR, PROTEINUR, UROBILINOGEN, NITRITE, LEUKOCYTESUR  No results found for: LABMICR, WBCUA, RBCUA, LABEPIT, MUCUS, BACTERIA  Pertinent Imaging: n/a No results found for this or any previous visit. No results found for this or any previous visit. No results found for this or any previous visit. No results found for this or any previous visit. No results found for this or any previous visit. No results found for this or any previous visit. No results found for this or any previous visit. No results found for this or any previous visit.  Assessment & Plan:    1. Hematospermia Likely from BPH.  Exam normal. PSA normal.  - Urinalysis, Complete  2. BPH - discussed meds, procedures. He will monitor.   No follow-ups on file.  Festus Aloe, MD  [facility] [address], [place], [place] [address] [phone]

## 2020-02-25 NOTE — Patient Instructions (Signed)

## 2020-02-28 ENCOUNTER — Telehealth: Payer: Self-pay | Admitting: Family Medicine

## 2020-02-28 NOTE — Telephone Encounter (Signed)
Pt wife is calling and her husband is having trouble sleeping. Wife is requesting OTC sleep medication or rx. Pt has high blood pressure. Pt last seen dr Sherrie Mustache on 01-25-2020.  [facility] in  [place]. Please advise

## 2020-02-29 NOTE — Telephone Encounter (Signed)
Pt's wife advised per notes of Dr. Sherrie Mustache on 02/29/20. Understanding verbalized.

## 2020-02-29 NOTE — Telephone Encounter (Signed)
He can take OTC melatonin up to 15mg  every night to help sleep. Will not affect blood pressure.

## 2020-02-29 NOTE — Telephone Encounter (Signed)
Left message to call back. OK for [facility] triage to advise patient's wife as below.

## 2020-03-06 DIAGNOSIS — R002 Palpitations: Secondary | ICD-10-CM | POA: Diagnosis not present

## 2020-03-20 DIAGNOSIS — E876 Hypokalemia: Secondary | ICD-10-CM | POA: Diagnosis not present

## 2020-03-20 DIAGNOSIS — M898X9 Other specified disorders of bone, unspecified site: Secondary | ICD-10-CM | POA: Diagnosis not present

## 2020-03-20 DIAGNOSIS — M79671 Pain in right foot: Secondary | ICD-10-CM | POA: Diagnosis not present

## 2020-03-20 DIAGNOSIS — I10 Essential (primary) hypertension: Secondary | ICD-10-CM | POA: Diagnosis not present

## 2020-03-20 DIAGNOSIS — Z8679 Personal history of other diseases of the circulatory system: Secondary | ICD-10-CM | POA: Diagnosis not present

## 2020-03-20 DIAGNOSIS — M2021 Hallux rigidus, right foot: Secondary | ICD-10-CM | POA: Diagnosis not present

## 2020-04-27 ENCOUNTER — Emergency Department: Payer: BC Managed Care – PPO

## 2020-04-27 ENCOUNTER — Emergency Department
Admission: EM | Admit: 2020-04-27 | Discharge: 2020-04-27 | Disposition: A | Discharge status: Home / Self Care | Payer: BC Managed Care – PPO | Attending: Emergency Medicine | Admitting: Emergency Medicine

## 2020-04-27 ENCOUNTER — Encounter: Payer: Self-pay | Admitting: Emergency Medicine

## 2020-04-27 ENCOUNTER — Other Ambulatory Visit: Payer: Self-pay

## 2020-04-27 DIAGNOSIS — Y9389 Activity, other specified: Secondary | ICD-10-CM | POA: Diagnosis not present

## 2020-04-27 DIAGNOSIS — X501XXA Overexertion from prolonged static or awkward postures, initial encounter: Secondary | ICD-10-CM | POA: Insufficient documentation

## 2020-04-27 DIAGNOSIS — Z79899 Other long term (current) drug therapy: Secondary | ICD-10-CM | POA: Insufficient documentation

## 2020-04-27 DIAGNOSIS — S2241XA Multiple fractures of ribs, right side, initial encounter for closed fracture: Secondary | ICD-10-CM | POA: Diagnosis not present

## 2020-04-27 DIAGNOSIS — Y999 Unspecified external cause status: Secondary | ICD-10-CM | POA: Diagnosis not present

## 2020-04-27 DIAGNOSIS — S299XXA Unspecified injury of thorax, initial encounter: Secondary | ICD-10-CM | POA: Diagnosis not present

## 2020-04-27 DIAGNOSIS — I10 Essential (primary) hypertension: Secondary | ICD-10-CM | POA: Diagnosis not present

## 2020-04-27 DIAGNOSIS — Y929 Unspecified place or not applicable: Secondary | ICD-10-CM | POA: Insufficient documentation

## 2020-04-27 HISTORY — DX: Unspecified atrial fibrillation: I48.91

## 2020-04-27 MED ORDER — NAPROXEN 250 MG PO TABS: 500.0000 mg | ORAL_TABLET | Freq: Two times a day (BID) | ORAL | 0 refills | Status: DC

## 2020-04-27 MED ORDER — HYDROCODONE-ACETAMINOPHEN 5-325 MG PO TABS: 1.0000 | ORAL_TABLET | ORAL | 0 refills | Status: DC | PRN

## 2020-04-27 NOTE — ED Provider Notes (Signed)
[facility] Emergency Department Provider Note ____________________________________________  Time seen: Approximately 10:25 PM  I have reviewed the triage vital signs and the nursing notes.   HISTORY  Chief Complaint Rib Injury    HPI Peter Wyatt is a 54 y.o. male who presents to the emergency department for evaluation and treatment of right side rib pain.  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  Pain increases with deep breath, cough, or laugh.  No alleviating measures attempted prior to arrival.   Past Medical History:  Diagnosis Date  . Atrial fibrillation [facility])     Patient Active Problem List   Diagnosis Date Noted  . Situational anxiety 01/25/2020  . DDD (degenerative disc disease), cervical 03/09/2019  . Paroxysmal atrial fibrillation ([place]) 02/08/2019  . History of atrial fibrillation 04/02/2018  . Headache 10/10/2015  . Hypertension 10/10/2015  . Premature atrial contractions 11/30/2013    History reviewed. No pertinent surgical history.  Prior to Admission medications   Medication Sig Start Date End Date Taking? Authorizing Provider  aspirin-acetaminophen-caffeine (EXCEDRIN MIGRAINE) [phone] MG tablet Take 1 tablet by mouth every 6 (six) hours as needed for headache.    [provider]  busPIRone (BUSPAR) 15 MG tablet Take 1 tablet (15 mg total) by mouth 2 (two) times daily. 01/25/20   Birdie Sons, MD  diclofenac sodium (VOLTAREN) 1 % GEL Apply topically. 07/19/19 07/18/20  [provider]  diltiazem (CARDIZEM CD) 240 MG 24 hr capsule TAKE ONE CAPSULE BY MOUTH EVERY DAY 12/01/19   Birdie Sons, MD  HYDROcodone-acetaminophen (NORCO/VICODIN) 5-325 MG tablet Take 1 tablet by mouth every 4 (four) hours as needed for moderate pain. 04/27/20 04/27/21  Darek Eifler, Johnette Abraham B, FNP  naproxen (NAPROSYN) 250 MG  tablet Take 2 tablets (500 mg total) by mouth 2 (two) times daily with a meal. 04/27/20   Moria Brophy B, FNP  potassium chloride SA (KLOR-CON) 20 MEQ tablet Take 20 mEq by mouth 2 (two) times daily. 02/10/20   [provider]    Allergies Patient has no known allergies.  Family History  Problem Relation Age of Onset  . Hypertension Father   . Prostate cancer Father   . Prostate cancer Paternal Grandfather     Social History Social History   Tobacco Use  . Smoking status: Never Smoker  . Smokeless tobacco: Never Used  Substance Use Topics  . Alcohol use: No  . Drug use: No    Review of Systems Constitutional: Negative for fever. Cardiovascular: Negative for chest pain. Respiratory: Negative for shortness of breath. Musculoskeletal: Positive for right lower rib pain Skin: Negative for open wound or lesion Neurological: Negative for decrease in sensation  ____________________________________________   PHYSICAL EXAM:  VITAL SIGNS: ED Triage Vitals  Enc Vitals Group     BP 04/27/20 1933 (!) 158/109     Pulse Rate 04/27/20 1933 84     Resp 04/27/20 1933 18     Temp 04/27/20 1933 98 F (36.7 C)     Temp Source 04/27/20 1933 Oral     SpO2 04/27/20 1933 100 %     Weight 04/27/20 1934 165 lb (74.8 kg)     Height 04/27/20 1934 5\' 11"  (1.803 m)     Head Circumference --      Peak Flow --      Pain Score  04/27/20 1934 5     Pain Loc --      Pain Edu? --      Excl. in GC? --     Constitutional: Alert and oriented. Well appearing and in no acute distress. Eyes: Conjunctivae are clear without discharge or drainage Head: Atraumatic Neck: Supple. Respiratory: No cough. Respirations are even and unlabored. Musculoskeletal: Focal tenderness over the right lower, anterior ribs Neurologic: Awake, alert, oriented. Skin: No open wounds or lesions noted over the lower anterior ribs on the right side.  He does have some ecchymosis and hematoma in the  forearms Psychiatric: Affect and behavior are appropriate.  ____________________________________________   LABS (all labs ordered are listed, but only abnormal results are displayed)  Labs Reviewed - No data to display ____________________________________________  RADIOLOGY  Ninth and 10th nondisplaced rib fractures  I, Sephiroth Mcluckie, personally viewed and evaluated these images (plain radiographs) as part of my medical decision making, as well as reviewing the written report by the radiologist.  DG Ribs Unilateral W/Chest Right  Result Date: 04/27/2020 CLINICAL DATA:  Injury pain to right lower rib cage EXAM: RIGHT RIBS AND CHEST - 3+ VIEW COMPARISON:  04/01/2018 FINDINGS: Single-view chest demonstrates no focal opacity or pleural effusion. Normal heart size. No pneumothorax. Right rib series demonstrates acute nondisplaced right ninth and tenth rib fractures. IMPRESSION: Acute nondisplaced right ninth and tenth rib fractures. No pneumothorax. Electronically Signed   By: Jasmine Pang M.D.   On: 04/27/2020 20:10   ____________________________________________   PROCEDURES  Procedures  ____________________________________________   INITIAL IMPRESSION / ASSESSMENT AND PLAN / ED COURSE  Peter Wyatt is a 54 y.o. who presents to the emergency department for treatment and evaluation of right anterior rib pain.  See HPI for further details.  X-ray does show nondisplaced ninth and 10th rib fractures.  He will be treated symptomatically.  He was advised to splint the area when he needs to cough or sneeze.  He will be prescribed Norco and Naprosyn.  He was advised not to take the Norco while working.  He was advised that he needs to take deep breaths several times a day to prevent pneumonia.  For symptoms of concern he is to see primary care or return to the emergency department.  Medications - No data to display  Pertinent labs & imaging results that were available during my care of  the patient were reviewed by me and considered in my medical decision making (see chart for details).   _________________________________________   FINAL CLINICAL IMPRESSION(S) / ED DIAGNOSES  Final diagnoses:  Closed fracture of multiple ribs of right side, initial encounter    ED Discharge Orders         Ordered    HYDROcodone-acetaminophen (NORCO/VICODIN) 5-325 MG tablet  Every 4 hours PRN     04/27/20 2239    naproxen (NAPROSYN) 250 MG tablet  2 times daily with meals     04/27/20 2239           If controlled substance prescribed during this visit, 12 month history viewed on the [facility] prior to issuing an initial prescription for Schedule II or III opiod.   Chinita Pester, FNP 04/28/20 0017    Emily Filbert, MD 04/28/20 1459

## 2020-04-27 NOTE — ED Notes (Signed)
Pt was moving something off of work truck and felt sudden pain in right lower rib cage. Pt states no SOB but pain increases with deep breathing. Pt sitting in chair, no apparent distress.

## 2020-04-27 NOTE — ED Triage Notes (Signed)
Patient ambulatory to triage with steady gait, without difficulty or distress noted, mask in place; pt reports approx 2pm today, was leaning over onto tool box and felt sudden pain to rt lower ribcage; c/o persistent pain since--tender to palpation; pain increases with deep breathing

## 2020-05-30 ENCOUNTER — Other Ambulatory Visit: Payer: Self-pay | Admitting: Family Medicine

## 2020-05-30 DIAGNOSIS — I480 Paroxysmal atrial fibrillation: Secondary | ICD-10-CM

## 2020-05-30 NOTE — Telephone Encounter (Signed)
Requested Prescriptions  Pending Prescriptions Disp Refills  . diltiazem (CARDIZEM CD) 240 MG 24 hr capsule [Pharmacy Med Name: DILTIAZEM CD 240MG  CAPSULES (24 HR)] 90 capsule 0    Sig: TAKE 1 CAPSULE BY MOUTH EVERY DAY     Cardiovascular:  Calcium Channel Blockers Failed - 05/30/2020  3:32 AM      Failed - Last BP in normal range    BP Readings from Last 1 Encounters:  04/27/20 (!) 148/75         Passed - Valid encounter within last 6 months    Recent Outpatient Visits          4 months ago Hemospermia   [facility], MD   8 months ago Situational anxiety   [facility], MD   11 months ago Essential hypertension   [facility], MD   1 year ago DDD (degenerative disc disease), cervical   [facility], MD   1 year ago Essential hypertension   [facility], MD      Future Appointments            In 8 months Demetrios Isaacs, MD [facility]

## 2020-05-31 DIAGNOSIS — R200 Anesthesia of skin: Secondary | ICD-10-CM | POA: Diagnosis not present

## 2020-05-31 DIAGNOSIS — R202 Paresthesia of skin: Secondary | ICD-10-CM | POA: Diagnosis not present

## 2020-06-01 ENCOUNTER — Telehealth: Payer: Self-pay | Admitting: Family Medicine

## 2020-06-01 NOTE — Telephone Encounter (Signed)
Copied from CRM [id number]. Topic: Quick Communication - Rx Refill/Question >> Jun 01, 2020  1:47 PM Sedonia Small, [place] A wrote: Medication: Blood pressure medication (Patients spouse stated that patient needs a supply of medication to last unti his upcoming appointment with Dr. Sherrie Mustache. Patient is completely out of medication and would like a callback from nurse once short supply has been sent to pharmacy.)  Has the patient contacted their pharmacy? yes (Agent: If no, request that the patient contact the pharmacy for the refill.) (Agent: If yes, when and what did the pharmacy advise?)Contact PCP  Preferred Pharmacy (with phone number or street name): [id number] - [place], [place] - [address] [place] [address] AT [facility] [address] [facility] [address]  Phone:  [phone] Fax:  [phone]     Agent: Please be advised that RX refills may take up to 3 business days. We ask that you follow-up with your pharmacy.

## 2020-06-01 NOTE — Telephone Encounter (Signed)
Cardizem refilled 05/30/2020. Attempted to reach pt, VM full.

## 2020-06-05 ENCOUNTER — Encounter: Payer: Self-pay | Admitting: Family Medicine

## 2020-06-05 ENCOUNTER — Other Ambulatory Visit: Payer: Self-pay

## 2020-06-05 ENCOUNTER — Ambulatory Visit: Payer: BC Managed Care – PPO | Admitting: Family Medicine

## 2020-06-05 VITALS — BP 136/70 | HR 76 | Temp 96.8°F | Wt 170.0 lb

## 2020-06-05 DIAGNOSIS — F418 Other specified anxiety disorders: Secondary | ICD-10-CM | POA: Diagnosis not present

## 2020-06-05 DIAGNOSIS — I10 Essential (primary) hypertension: Secondary | ICD-10-CM

## 2020-06-05 NOTE — Progress Notes (Signed)
I,Laura E Walsh,acting as a scribe for [name], MD.,have documented all relevant documentation on the behalf of [name], MD,as directed by  [name], MD while in the presence of [name], MD.   Established patient visit   Patient: Peter Wyatt   DOB: November 26, 1966   54 y.o. Male  MRN: [id number] Visit Date: 06/05/2020  Today's healthcare provider: [name], MD   Chief Complaint  Patient presents with  . Hypertension   Subjective    HPI   Hypertension, follow-up  BP Readings from Last 3 Encounters:  06/05/20 136/70  04/27/20 (!) 148/75  02/25/20 (!) 154/101   Wt Readings from Last 3 Encounters:  06/05/20 170 lb (77.1 kg)  04/27/20 165 lb (74.8 kg)  02/25/20 166 lb (75.3 kg)     He was last seen for hypertension 4 months ago.  BP at that visit was 130/84. Management since that visit includes no changes.  He reports excellent compliance with treatment. He is not having side effects.  He is following a Low Sodium diet. He is exercising. He does not smoke.  Use of agents associated with hypertension: none.   Outside blood pressures are running high in the afternoons. He fractures right ribs last month and has been more sedentary and eating more sodium since then, which he thinks may be contributing to elevated blood pressures.  Symptoms: No chest pain No chest pressure  Yes palpitations No syncope  No dyspnea No orthopnea  No paroxysmal nocturnal dyspnea No lower extremity edema   Pertinent labs: Lab Results  Component Value Date   CHOL 130 06/29/2019   HDL 57 06/29/2019   LDLCALC 61 06/29/2019   TRIG 59 06/29/2019   CHOLHDL 2.3 06/29/2019   Lab Results  Component Value Date   NA 142 06/29/2019   K 3.6 06/29/2019   CREATININE 0.86 06/29/2019   GFRNONAA 100 06/29/2019   GFRAA 116 06/29/2019   GLUCOSE 80 06/29/2019     The 10-year ASCVD risk score Denman George DC Jr., et al., 2013) is: 2.8%    --------------------------------------------------------------------------------------------------- Follow up for anxiety  The patient was last seen for this 4 months ago. Changes made at last visit include no changes.  He reports excellent compliance with treatment. He feels that condition is stable with current medications.Marland Kitchen He is not having side effects.   -----------------------------------------------------------------------------------------       Medications: Outpatient Medications Prior to Visit  Medication Sig  . aspirin-acetaminophen-caffeine (EXCEDRIN MIGRAINE) 250-250-65 MG tablet Take 1 tablet by mouth every 6 (six) hours as needed for headache.  . busPIRone (BUSPAR) 15 MG tablet Take 1 tablet (15 mg total) by mouth 2 (two) times daily.  . diclofenac sodium (VOLTAREN) 1 % GEL Apply topically.  Marland Kitchen diltiazem (CARDIZEM CD) 240 MG 24 hr capsule TAKE 1 CAPSULE BY MOUTH EVERY DAY  . naproxen (NAPROSYN) 250 MG tablet Take 2 tablets (500 mg total) by mouth 2 (two) times daily with a meal.  . potassium chloride SA (KLOR-CON) 20 MEQ tablet Take 20 mEq by mouth 2 (two) times daily.  Marland Kitchen HYDROcodone-acetaminophen (NORCO/VICODIN) 5-325 MG tablet Take 1 tablet by mouth every 4 (four) hours as needed for moderate pain.   No facility-administered medications prior to visit.    Review of Systems  Constitutional: Negative.   Respiratory: Negative.   Cardiovascular: Positive for palpitations. Negative for chest pain and leg swelling.  Gastrointestinal: Negative.   Neurological: Negative for dizziness, light-headedness and headaches.  Objective    BP 136/70 (BP Location: Left Arm, Patient Position: Sitting, Cuff Size: Large)   Pulse 76   Temp (!) 96.8 F (36 C) (Temporal)   Wt 170 lb (77.1 kg)   SpO2 97%   BMI 23.71 kg/m  BP Readings from Last 3 Encounters:  06/05/20 136/70  04/27/20 (!) 148/75  02/25/20 (!) 154/101      Physical Exam   General: Appearance:     Well developed, well nourished male in no acute distress  Eyes:    PERRL, conjunctiva/corneas clear, EOM's intact       Lungs:     Clear to auscultation bilaterally, respirations unlabored  Heart:    Normal heart rate. Regular rhythm. No murmurs, rubs, or gallops.   MS:   All extremities are intact.   Neurologic:   Awake, alert, oriented x 3. No apparent focal neurological           defect.         No results found for any visits on 06/05/20.  Assessment & Plan     1. Essential hypertension Well controlled in office. He has had some elevated readings since fracturing ribs at work last month. He is working on being more active and cutting out sodium, and is returning to work this week. Continue current medications for now and follow up for CPE in the fall.   2. Situational anxiety He feels buspirone is working well and wishes to continue current dose.    No follow-ups on file.      The entirety of the information documented in the History of Present Illness, Review of Systems and Physical Exam were personally obtained by me. Portions of this information were initially documented by the CMA and reviewed by me for thoroughness and accuracy.      Lelon Huh, MD  [facility] [phone] (phone) [phone] (fax)  [place]

## 2020-06-13 DIAGNOSIS — Z8679 Personal history of other diseases of the circulatory system: Secondary | ICD-10-CM | POA: Diagnosis not present

## 2020-06-13 DIAGNOSIS — I10 Essential (primary) hypertension: Secondary | ICD-10-CM | POA: Diagnosis not present

## 2020-06-13 DIAGNOSIS — E876 Hypokalemia: Secondary | ICD-10-CM | POA: Diagnosis not present

## 2020-06-30 DIAGNOSIS — G5601 Carpal tunnel syndrome, right upper limb: Secondary | ICD-10-CM | POA: Diagnosis not present

## 2020-06-30 DIAGNOSIS — G5602 Carpal tunnel syndrome, left upper limb: Secondary | ICD-10-CM | POA: Diagnosis not present

## 2020-07-19 ENCOUNTER — Other Ambulatory Visit: Payer: Self-pay | Admitting: Family Medicine

## 2020-07-19 DIAGNOSIS — F418 Other specified anxiety disorders: Secondary | ICD-10-CM

## 2020-07-19 MED ORDER — BUSPIRONE HCL 15 MG PO TABS: 15.0000 mg | ORAL_TABLET | Freq: Two times a day (BID) | ORAL | 4 refills | Status: DC

## 2020-08-23 ENCOUNTER — Other Ambulatory Visit: Payer: Self-pay | Admitting: Family Medicine

## 2020-08-23 DIAGNOSIS — I480 Paroxysmal atrial fibrillation: Secondary | ICD-10-CM

## 2020-08-23 MED ORDER — DILTIAZEM HCL ER COATED BEADS 240 MG PO CP24: 240.0000 mg | ORAL_CAPSULE | Freq: Every day | ORAL | 1 refills | Status: DC

## 2020-08-31 NOTE — Progress Notes (Signed)
Peter Wyatt,acting as a scribe for Peter Merry, MD.,have documented all relevant documentation on the behalf of Peter Merry, MD,as directed by  Peter Merry, MD while in the presence of Peter Merry, MD.  Complete physical exam   Patient: Peter Wyatt   DOB: [birth year]   54 y.o. Male  MRN: [id number] Visit Date: 09/01/2020  Today's healthcare provider: [name], MD   Chief Complaint  Patient presents with  . Annual Exam   Subjective    Peter Wyatt is a 54 y.o. male who presents today for a complete physical exam.  He reports consuming a low sodium diet. Home exercise routine includes walking, arm and stomach exercises. He generally feels well. He reports sleeping well. He does not have additional problems to discuss today.   HPI  Follow up anxiety  He continues on buspirone which he feels his working well and having no adverse effects. He has cut back to 1 tablet daily and continues to do well. He wishes to continue current regiments  He also continues on diltiazem with history of PAF and PACs. He continues to see cardiology periodically. Denies CP, dyspnea or palpitations.   Past Medical History:  Diagnosis Date  . Atrial fibrillation (HCC)    No past surgical history on file. Social History   Socioeconomic History  . Marital status: Married    Spouse name: Not on file  . Number of children: 2  . Years of education: Not on file  . Highest education level: Not on file  Occupational History  . Occupation: Self Employed  Tobacco Use  . Smoking status: Never Smoker  . Smokeless tobacco: Never Used  Substance and Sexual Activity  . Alcohol use: No  . Drug use: No  . Sexual activity: Not on file  Other Topics Concern  . Not on file  Social History Narrative  . Not on file   Social Determinants of Health   Financial Resource Strain:   . Difficulty of Paying Living Expenses: Not on file  Food Insecurity:   . Worried About Patent examiner in the Last Year: Not on file  . Ran Out of Food in the Last Year: Not on file  Transportation Needs:   . Lack of Transportation (Medical): Not on file  . Lack of Transportation (Non-Medical): Not on file  Physical Activity:   . Days of Exercise per Week: Not on file  . Minutes of Exercise per Session: Not on file  Stress:   . Feeling of Stress : Not on file  Social Connections:   . Frequency of Communication with Friends and Family: Not on file  . Frequency of Social Gatherings with Friends and Family: Not on file  . Attends Religious Services: Not on file  . Active Member of Clubs or Organizations: Not on file  . Attends Banker Meetings: Not on file  . Marital Status: Not on file  Intimate Partner Violence:   . Fear of Current or Ex-Partner: Not on file  . Emotionally Abused: Not on file  . Physically Abused: Not on file  . Sexually Abused: Not on file   Family Status  Relation Name Status  . Mother  Alive       PVC  . Father  Alive       pacemaker  . PGF  (Not Specified)   Family History  Problem Relation Age of Onset  . Hypertension Father   . Prostate cancer Father   .  Prostate cancer Paternal Grandfather    No Known Allergies  Patient Care Team: Malva Limes, MD as PCP - General (Family Medicine) Kandyce Rud., MD (Rheumatology) Lonell Face, MD as Consulting Physician (Neurology)   Medications: Outpatient Medications Prior to Visit  Medication Sig  . aspirin-acetaminophen-caffeine (EXCEDRIN MIGRAINE) 250-250-65 MG tablet Take 1 tablet by mouth every 6 (six) hours as needed for headache.  . busPIRone (BUSPAR) 15 MG tablet Take 1 tablet (15 mg total) by mouth 2 (two) times daily. (Patient taking differently: Take 15 mg by mouth daily. )  . diltiazem (CARDIZEM CD) 240 MG 24 hr capsule Take 1 capsule (240 mg total) by mouth daily.  . naproxen (NAPROSYN) 250 MG tablet Take 2 tablets (500 mg total) by mouth 2 (two) times daily  with a meal.  . potassium chloride SA (KLOR-CON) 20 MEQ tablet Take 20 mEq by mouth 2 (two) times daily.   No facility-administered medications prior to visit.    Review of Systems  Constitutional: Negative.   HENT: Negative.   Eyes: Negative.   Respiratory: Negative.   Cardiovascular: Negative.   Gastrointestinal: Negative.   Endocrine: Negative.   Genitourinary: Negative.   Musculoskeletal: Negative.   Neurological: Negative.   Hematological: Negative.   Psychiatric/Behavioral: The patient is nervous/anxious.      Objective    BP 138/89 (BP Location: Right Arm, Patient Position: Sitting, Cuff Size: Normal)   Pulse 65   Temp 98.2 F (36.8 C) (Oral)   Ht 5\' 11"  (1.803 m)   Wt 165 lb 6.4 oz (75 kg)   BMI 23.07 kg/m   Physical Exam   General Appearance:    Well developed, well nourished male. Alert, cooperative, in no acute distress, appears stated age  Head:    Normocephalic, without obvious abnormality, atraumatic  Eyes:    PERRL, conjunctiva/corneas clear, EOM's intact, fundi    benign, both eyes       Ears:    Normal TM's and external ear canals, both ears  Nose:   Nares normal, septum midline, mucosa normal, no drainage   or sinus tenderness  Throat:   Lips, mucosa, and tongue normal; teeth and gums normal  Neck:   Supple, symmetrical, trachea midline, no adenopathy;       thyroid:  No enlargement/tenderness/nodules; no carotid   bruit or JVD  Back:     Symmetric, no curvature, ROM normal, no CVA tenderness  Lungs:     Clear to auscultation bilaterally, respirations unlabored  Chest wall:    No tenderness or deformity  Heart:    Normal heart rate. Regular rhythm. No murmurs, rubs, or gallops.  S1 and S2 normal  Abdomen:     Soft, non-tender, bowel sounds active all four quadrants,    no masses, no organomegaly  Genitalia:    deferred  Rectal:    deferred  Extremities:   All extremities are intact. No cyanosis or edema  Pulses:   2+ and symmetric all  extremities  Skin:   Skin color, texture, turgor normal, no rashes or lesions  Lymph nodes:   Cervical, supraclavicular, and axillary nodes normal  Neurologic:   CNII-XII intact. Normal strength, sensation and reflexes      throughout     Last depression screening scores PHQ 2/9 Scores 03/09/2019 01/07/2018  PHQ - 2 Score - 0  Exception Documentation Patient refusal -   Last fall risk screening No flowsheet data found. Last Audit-C alcohol use screening Alcohol Use Disorder  Test (AUDIT) 07/13/2018  1. How often do you have a drink containing alcohol? 0  2. How many drinks containing alcohol do you have on a typical day when you are drinking? 0  3. How often do you have six or more drinks on one occasion? 0  AUDIT-C Score 0   A score of 3 or more in women, and 4 or more in men indicates increased risk for alcohol abuse, EXCEPT if all of the points are from question 1   No results found for any visits on 09/01/20.  Assessment & Plan    Routine Health Maintenance and Physical Exam  Exercise Activities and Dietary recommendations Goals   None     Immunization History  Administered Date(s) Administered  . Influenza,inj,Quad PF,6+ Mos 02/08/2019, 10/01/2019  . Tdap 02/08/2019  . Zoster Recombinat (Shingrix) 02/08/2019, 06/29/2019    Health Maintenance  Topic Date Due  . Hepatitis C Screening  Never done  . COVID-19 Vaccine (1) Never done  . INFLUENZA VACCINE  07/23/2020  . Fecal DNA (Cologuard)  01/19/2021  . TETANUS/TDAP  02/08/2029  . HIV Screening  Completed    Discussed health benefits of physical activity, and encouraged him to engage in regular exercise appropriate for his age and condition.  1. Annual physical exam Unremarkable exam. Encourage vaccination for Covid and flu vaccine. He is considering scheduling appt at our vaccine clinic.  - Comprehensive metabolic panel  2. Situational anxiety Doing well on one buspirone daily. Advised he could go back to BID  if needed.   3. Paroxysmal atrial fibrillation (HCC) Well controlled on current dose of diltiazem. Followed by [facility] cardiology.   4. Need for hepatitis C screening test  - Hepatitis C antibody  5. Prostate cancer screening  - PSA Total (Reflex To Free) ([facility] only)   No follow-ups on file.     The entirety of the information documented in the History of Present Illness, Review of Systems and Physical Exam were personally obtained by me. Portions of this information were initially documented by the CMA and reviewed by me for thoroughness and accuracy.      Peter Merry, MD  [facility] [phone] (phone) [phone] (fax)  [facility]

## 2020-09-01 ENCOUNTER — Other Ambulatory Visit: Payer: Self-pay

## 2020-09-01 ENCOUNTER — Encounter: Payer: Self-pay | Admitting: Family Medicine

## 2020-09-01 ENCOUNTER — Ambulatory Visit (INDEPENDENT_AMBULATORY_CARE_PROVIDER_SITE_OTHER): Payer: BC Managed Care – PPO | Admitting: Family Medicine

## 2020-09-01 VITALS — BP 138/89 | HR 65 | Temp 98.2°F | Ht 71.0 in | Wt 165.4 lb

## 2020-09-01 DIAGNOSIS — Z0000 Encounter for general adult medical examination without abnormal findings: Secondary | ICD-10-CM | POA: Diagnosis not present

## 2020-09-01 DIAGNOSIS — F418 Other specified anxiety disorders: Secondary | ICD-10-CM | POA: Diagnosis not present

## 2020-09-01 DIAGNOSIS — I480 Paroxysmal atrial fibrillation: Secondary | ICD-10-CM | POA: Diagnosis not present

## 2020-09-01 DIAGNOSIS — Z1159 Encounter for screening for other viral diseases: Secondary | ICD-10-CM

## 2020-09-01 DIAGNOSIS — Z125 Encounter for screening for malignant neoplasm of prostate: Secondary | ICD-10-CM | POA: Diagnosis not present

## 2020-09-01 NOTE — Patient Instructions (Addendum)
.   You will be due for Cologuard for colon screening in January of 2022. You should receive a collection kit at your home around that time. Check with your insurance before then to make sure it is covered now.  . Covid-19 vaccines: The Covid vaccines have been given to hundreds of millions of people and found to be very effective and are as safe as any other vaccine.  The [employer] vaccine has been associated with very rare dangerous blood clots, but only in adult women under the age of 29.  The risk of dying from Covid infections is much higher than having a serious reaction to the vaccine.  I strongly recommend getting fully vaccinated against Covid-19.   Adolescent boys may have fewer side effects from the [place] vaccine.  I recommend that adult women under 60 get fully vaccinated, but the [place] vaccines may be safer for those women than the [employer] vaccine.

## 2020-09-02 LAB — COMPREHENSIVE METABOLIC PANEL
ALT: 13 IU/L (ref 0–44)
AST: 18 IU/L (ref 0–40)
Albumin/Globulin Ratio: 2.1 (ref 1.2–2.2)
Albumin: 4.7 g/dL (ref 3.8–4.9)
Alkaline Phosphatase: 91 IU/L (ref 48–121)
BUN/Creatinine Ratio: 16 (ref 9–20)
BUN: 13 mg/dL (ref 6–24)
Bilirubin Total: 0.9 mg/dL (ref 0.0–1.2)
CO2: 25 mmol/L (ref 20–29)
Calcium: 9.3 mg/dL (ref 8.7–10.2)
Chloride: 103 mmol/L (ref 96–106)
Creatinine, Ser: 0.81 mg/dL (ref 0.76–1.27)
GFR calc Af Amer: 117 mL/min/{1.73_m2} (ref >59)
GFR calc non Af Amer: 101 mL/min/{1.73_m2} (ref >59)
Globulin, Total: 2.2 g/dL (ref 1.5–4.5)
Glucose: 86 mg/dL (ref 65–99)
Potassium: 4 mmol/L (ref 3.5–5.2)
Sodium: 140 mmol/L (ref 134–144)
Total Protein: 6.9 g/dL (ref 6.0–8.5)

## 2020-09-02 LAB — PSA TOTAL (REFLEX TO FREE): Prostate Specific Ag, Serum: 1 ng/mL (ref 0.0–4.0)

## 2020-09-02 LAB — HEPATITIS C ANTIBODY: Hep C Virus Ab: <0.1 s/co ratio (ref 0.0–0.9)

## 2020-09-04 ENCOUNTER — Ambulatory Visit
Admission: EM | Admit: 2020-09-04 | Discharge: 2020-09-04 | Disposition: A | Discharge status: Home / Self Care | Payer: BC Managed Care – PPO | Attending: Internal Medicine | Admitting: Internal Medicine

## 2020-09-04 ENCOUNTER — Encounter: Payer: Self-pay | Admitting: Emergency Medicine

## 2020-09-04 ENCOUNTER — Other Ambulatory Visit: Payer: Self-pay

## 2020-09-04 DIAGNOSIS — R05 Cough: Secondary | ICD-10-CM | POA: Diagnosis not present

## 2020-09-04 DIAGNOSIS — I10 Essential (primary) hypertension: Secondary | ICD-10-CM | POA: Diagnosis not present

## 2020-09-04 DIAGNOSIS — Z20822 Contact with and (suspected) exposure to covid-19: Secondary | ICD-10-CM | POA: Insufficient documentation

## 2020-09-04 DIAGNOSIS — I480 Paroxysmal atrial fibrillation: Secondary | ICD-10-CM | POA: Diagnosis not present

## 2020-09-04 DIAGNOSIS — F419 Anxiety disorder, unspecified: Secondary | ICD-10-CM | POA: Diagnosis not present

## 2020-09-04 DIAGNOSIS — J069 Acute upper respiratory infection, unspecified: Secondary | ICD-10-CM | POA: Insufficient documentation

## 2020-09-04 DIAGNOSIS — Z791 Long term (current) use of non-steroidal anti-inflammatories (NSAID): Secondary | ICD-10-CM | POA: Insufficient documentation

## 2020-09-04 DIAGNOSIS — Z79899 Other long term (current) drug therapy: Secondary | ICD-10-CM | POA: Diagnosis not present

## 2020-09-04 DIAGNOSIS — R519 Headache, unspecified: Secondary | ICD-10-CM | POA: Diagnosis not present

## 2020-09-04 DIAGNOSIS — R0981 Nasal congestion: Secondary | ICD-10-CM

## 2020-09-04 DIAGNOSIS — R059 Cough, unspecified: Secondary | ICD-10-CM

## 2020-09-04 LAB — SARS CORONAVIRUS 2 (TAT 6-24 HRS): SARS Coronavirus 2: NEGATIVE

## 2020-09-04 MED ORDER — OXYMETAZOLINE HCL 0.05 % NA SOLN: 1.0000 | Freq: Two times a day (BID) | NASAL | 0 refills | Status: AC

## 2020-09-04 NOTE — ED Triage Notes (Signed)
Patient c/o sinus congestion, mild cough and facial pain and pressure that started on Saturday.

## 2020-09-04 NOTE — Discharge Instructions (Addendum)

## 2020-09-04 NOTE — ED Provider Notes (Signed)
[facility]    CSN: [id number] Arrival date & time: 09/04/20  [id number]      History   Chief Complaint Chief Complaint  Patient presents with  . Facial Pain    HPI Peter Wyatt is a 54 y.o. male.   Patient presenting with 2-day history of sinus congestion, mild cough, and facial pain and pressure.  He denies known Covid exposure.  Patient has not been vaccinated for Covid.  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  Patient has been taking Excedrin for headaches and also a oral decongestant with mild improvement in symptoms.  Patient denies any known fevers, fatigue, weakness, chest pain, shortness of breath, nausea, vomiting.  Past medical history significant for atrial fibrillation, anxiety, and hypertension.  No other concerns today. HPI  Past Medical History:  Diagnosis Date  . Atrial fibrillation [facility](HCC)     Patient Active Problem List   Diagnosis Date Noted  . Situational anxiety 01/25/2020  . DDD (degenerative disc disease), cervical 03/09/2019  . Paroxysmal atrial fibrillation (HCC) 02/08/2019  . History of atrial fibrillation 04/02/2018  . Headache 10/10/2015  . Hypertension 10/10/2015  . Premature atrial contractions 11/30/2013    History reviewed. No pertinent surgical history.     Home Medications    Prior to Admission medications   Medication Sig Start Date End Date Taking? Authorizing Provider  aspirin-acetaminophen-caffeine (EXCEDRIN MIGRAINE) [id number] MG tablet Take 1 tablet by mouth every 6 (six) hours as needed for headache.   Yes [provider]  busPIRone (BUSPAR) 15 MG tablet Take 1 tablet (15 mg total) by mouth 2 (two) times daily. Patient taking differently: Take 15 mg by mouth daily.  07/19/20  Yes Malva LimesFisher, Donald E, MD  diltiazem (CARDIZEM CD) 240 MG 24 hr capsule Take 1 capsule (240 mg total) by mouth daily. 08/23/20  Yes Malva LimesFisher, Donald E, MD    naproxen (NAPROSYN) 250 MG tablet Take 2 tablets (500 mg total) by mouth 2 (two) times daily with a meal. 04/27/20  Yes Triplett, Cari B, FNP  potassium chloride SA (KLOR-CON) 20 MEQ tablet Take 20 mEq by mouth 2 (two) times daily. 02/10/20  Yes [provider]  oxymetazoline (AFRIN) 0.05 % nasal spray Place 1 spray into both nostrils 2 (two) times daily for 7 days. 09/04/20 09/11/20  Shirlee LatchEaves, Elayne Gruver B, PA-C    Family History Family History  Problem Relation Age of Onset  . Hypertension Father   . Prostate cancer Father   . Prostate cancer Paternal Grandfather     Social History Social History   Tobacco Use  . Smoking status: Never Smoker  . Smokeless tobacco: Never Used  Substance Use Topics  . Alcohol use: No  . Drug use: No     Allergies   Patient has no known allergies.   Review of Systems Review of Systems  Constitutional: Negative for fatigue and fever.  HENT: Positive for congestion, rhinorrhea, sinus pressure and sinus pain. Negative for sore throat.   Respiratory: Positive for cough. Negative for shortness of breath.   Gastrointestinal: Negative for abdominal pain, diarrhea, nausea and vomiting.  Musculoskeletal: Negative for myalgias.  Neurological: Positive for headaches. Negative for weakness and light-headedness.  Hematological: Negative for adenopathy.     Physical Exam Triage Vital Signs ED Triage Vitals  Enc Vitals Group     BP 09/04/20 0835 (!) 161/100     Pulse  Rate 09/04/20 0835 72     Resp 09/04/20 0835 18     Temp 09/04/20 0835 98.9 F (37.2 C)     Temp Source 09/04/20 0835 Oral     SpO2 09/04/20 0835 99 %     Weight 09/04/20 0834 160 lb (72.6 kg)     Height 09/04/20 0834 5\' 11"  (1.803 m)     Head Circumference --      Peak Flow --      Pain Score 09/04/20 0834 6     Pain Loc --      Pain Edu? --      Excl. in GC? --    No data found.  Updated Vital Signs BP (!) 161/100 (BP Location: Right Arm)   Pulse 72   Temp 98.9 F (37.2  C) (Oral)   Resp 18   Ht 5\' 11"  (1.803 m)   Wt 160 lb (72.6 kg)   SpO2 99%   BMI 22.32 kg/m       Physical Exam Vitals and nursing note reviewed.  Constitutional:      General: He is not in acute distress.    Appearance: Normal appearance. He is well-developed and normal weight. He is not toxic-appearing.  HENT:     Head: Normocephalic and atraumatic.     Right Ear: Tympanic membrane, ear canal and external ear normal.     Left Ear: Tympanic membrane, ear canal and external ear normal.     Nose: Congestion and rhinorrhea present.     Comments: Nasal mucosal edema, worse on left side.  He does have some tenderness of bilateral maxillary sinuses.    Mouth/Throat:     Mouth: Mucous membranes are moist.     Pharynx: Oropharynx is clear. Posterior oropharyngeal erythema present.  Eyes:     General: No scleral icterus.    Conjunctiva/sclera: Conjunctivae normal.  Cardiovascular:     Rate and Rhythm: Normal rate and regular rhythm.     Heart sounds: No murmur heard.   Pulmonary:     Effort: Pulmonary effort is normal. No respiratory distress.     Breath sounds: Normal breath sounds.  Musculoskeletal:     Cervical back: Neck supple.  Skin:    General: Skin is warm and dry.  Neurological:     General: No focal deficit present.     Mental Status: He is alert. Mental status is at baseline.     Motor: No weakness.     Gait: Gait normal.  Psychiatric:        Mood and Affect: Mood normal.        Behavior: Behavior normal.        Thought Content: Thought content normal.      UC Treatments / Results  Labs (all labs ordered are listed, but only abnormal results are displayed) Labs Reviewed  SARS CORONAVIRUS 2 (TAT 6-24 HRS)    EKG   Radiology No results found.  Procedures Procedures (including critical care time)  Medications Ordered in UC Medications - No data to display  Initial Impression / Assessment and Plan / UC Course  I have reviewed the triage vital  signs and the nursing notes.  Pertinent labs & imaging results that were available during my care of the patient were reviewed by me and considered in my medical decision making (see chart for details).   Covid test obtained on patient.  Discussed CDC guidelines, isolation protocol, ED precautions.  Work note provided.  Sent in Afrin  nasal spray and advise Coricidin HBP, as well as resting hydration.  Follow-up as needed.  Final Clinical Impressions(s) / UC Diagnoses   Final diagnoses:  Viral upper respiratory infection  Cough  Nasal sinus congestion     Discharge Instructions     URI/COLD SYMPTOMS: Your exam today is consistent with a viral illness. Antibiotics are not indicated at this time. Use medications as directed, including cough syrup, nasal saline, and decongestants. Your symptoms should improve over the next few days and resolve within 7-10 days. Increase rest and fluids. F/u if symptoms worsen or predominate such as sore throat, ear pain, productive cough, shortness of breath, or if you develop high fevers or worsening fatigue over the next several days.    You have received COVID testing today either for positive exposure, concerning symptoms that could be related to COVID infection, screening purposes, or re-testing after confirmed positive.  Your test obtained today checks for active viral infection in the last 1-2 weeks. If your test is negative now, you can still test positive later. So, if you do develop symptoms you should either get re-tested and/or isolate x 10 days. Please follow CDC guidelines.  While Rapid antigen tests come back in 15-20 minutes, send out PCR/molecular test results typically come back within 24 hours. In the mean time, if you are symptomatic, assume this could be a positive test and treat/monitor yourself as if you do have COVID.   We will call with test results. Please download the MyChart app and set up a profile to access test results.   If  symptomatic, go home and rest. Push fluids. Take Tylenol as needed for discomfort. Gargle warm salt water. Throat lozenges. Take Mucinex DM or Robitussin for cough. Humidifier in bedroom to ease coughing. Warm showers. Also review the COVID handout for more information.  COVID-19 INFECTION: The incubation period of COVID-19 is approximately 14 days after exposure, with most symptoms developing in roughly 4-5 days. Symptoms may range in severity from mild to critically severe. Roughly 80% of those infected will have mild symptoms. People of any age may become infected with COVID-19 and have the ability to transmit the virus. The most common symptoms include: fever, fatigue, cough, body aches, headaches, sore throat, nasal congestion, shortness of breath, nausea, vomiting, diarrhea, changes in smell and/or taste.    COURSE OF ILLNESS Some patients may begin with mild disease which can progress quickly into critical symptoms. If your symptoms are worsening please call ahead to the Emergency Department and proceed there for further treatment. Recovery time appears to be roughly 1-2 weeks for mild symptoms and 3-6 weeks for severe disease.   GO IMMEDIATELY TO ER FOR FEVER YOU ARE UNABLE TO GET DOWN WITH TYLENOL, BREATHING PROBLEMS, CHEST PAIN, FATIGUE, LETHARGY, INABILITY TO EAT OR DRINK, ETC  QUARANTINE AND ISOLATION: To help decrease the spread of COVID-19 please remain isolated if you have COVID infection or are highly suspected to have COVID infection. This means -stay home and isolate to one room in the home if you live with others. Do not share a bed or bathroom with others while ill, sanitize and wipe down all countertops and keep common areas clean and disinfected. You may discontinue isolation if you have a mild case and are asymptomatic 10 days after symptom onset as long as you have been fever free >24 hours without having to take Motrin or Tylenol. If your case is more severe (meaning you develop  pneumonia or are admitted in the  hospital), you may have to isolate longer.   If you have been in close contact (within 6 feet) of someone diagnosed with COVID 19, you are advised to quarantine in your home for 14 days as symptoms can develop anywhere from 2-14 days after exposure to the virus. If you develop symptoms, you  must isolate.  Most current guidelines for COVID after exposure -isolate 10 days if you ARE NOT tested for COVID as long as symptoms do not develop -isolate 7 days if you are tested and remain asymptomatic -You do not necessarily need to be tested for COVID if you have + exposure and        develop   symptoms. Just isolate at home x10 days from symptom onset During this global pandemic, CDC advises to practice social distancing, try to stay at least 68ft away from others at all times. Wear a face covering. Wash and sanitize your hands regularly and avoid going anywhere that is not necessary.  KEEP IN MIND THAT THE COVID TEST IS NOT 100% ACCURATE AND YOU SHOULD STILL DO EVERYTHING TO PREVENT POTENTIAL SPREAD OF VIRUS TO OTHERS (WEAR MASK, WEAR GLOVES, WASH HANDS AND SANITIZE REGULARLY). IF INITIAL TEST IS NEGATIVE, THIS MAY NOT MEAN YOU ARE DEFINITELY NEGATIVE. MOST ACCURATE TESTING IS DONE 5-7 DAYS AFTER EXPOSURE.   It is not advised by CDC to get re-tested after receiving a positive COVID test since you can still test positive for weeks to months after you have already cleared the virus.   *If you have not been vaccinated for COVID, I strongly suggest you consider getting vaccinated as long as there are no contraindications.      ED Prescriptions    Medication Sig Dispense Auth. Provider   oxymetazoline (AFRIN) 0.05 % nasal spray Place 1 spray into both nostrils 2 (two) times daily for 7 days. 1.4 mL Shirlee Latch, PA-C     PDMP not reviewed this encounter.   Shirlee Latch, PA-C 09/04/20 [phone]

## 2020-09-25 ENCOUNTER — Other Ambulatory Visit: Payer: Self-pay

## 2020-09-25 ENCOUNTER — Encounter: Payer: Self-pay | Admitting: Emergency Medicine

## 2020-09-25 ENCOUNTER — Ambulatory Visit
Admission: EM | Admit: 2020-09-25 | Discharge: 2020-09-25 | Disposition: A | Discharge status: Home / Self Care | Payer: BC Managed Care – PPO | Attending: Family Medicine | Admitting: Family Medicine

## 2020-09-25 DIAGNOSIS — Z20822 Contact with and (suspected) exposure to covid-19: Secondary | ICD-10-CM | POA: Insufficient documentation

## 2020-09-25 DIAGNOSIS — Z0189 Encounter for other specified special examinations: Secondary | ICD-10-CM

## 2020-09-25 LAB — SARS CORONAVIRUS 2 (TAT 6-24 HRS): SARS Coronavirus 2: NEGATIVE

## 2020-09-25 NOTE — Discharge Instructions (Signed)

## 2020-09-25 NOTE — ED Triage Notes (Signed)
Patient here for COVID Test.  Patient denies any COVID symptoms.

## 2020-11-30 DIAGNOSIS — Z20822 Contact with and (suspected) exposure to covid-19: Secondary | ICD-10-CM | POA: Diagnosis not present

## 2020-12-03 ENCOUNTER — Telehealth: Payer: Self-pay | Admitting: Emergency Medicine

## 2020-12-03 ENCOUNTER — Encounter: Payer: Self-pay | Admitting: Emergency Medicine

## 2020-12-03 ENCOUNTER — Ambulatory Visit
Admission: EM | Admit: 2020-12-03 | Discharge: 2020-12-03 | Disposition: A | Discharge status: Home / Self Care | Payer: BC Managed Care – PPO | Attending: Physician Assistant | Admitting: Physician Assistant

## 2020-12-03 ENCOUNTER — Other Ambulatory Visit: Payer: Self-pay

## 2020-12-03 DIAGNOSIS — J069 Acute upper respiratory infection, unspecified: Secondary | ICD-10-CM | POA: Diagnosis not present

## 2020-12-03 DIAGNOSIS — Z20822 Contact with and (suspected) exposure to covid-19: Secondary | ICD-10-CM

## 2020-12-03 LAB — RESP PANEL BY RT-PCR (FLU A&B, COVID) ARPGX2
Influenza A by PCR: NEGATIVE
Influenza B by PCR: NEGATIVE
SARS Coronavirus 2 by RT PCR: POSITIVE — AB

## 2020-12-03 NOTE — ED Provider Notes (Signed)
[facility]    CSN: [id number] Arrival date & time: 12/03/20  1447      History   Chief Complaint Chief Complaint  Patient presents with  . Covid Exposure  . Headache    HPI Peter Wyatt is a 54 y.o. male.   Peter Wyatt presents with complaints of  Cough, chest burning, headache, and low grade temps (tmax of 100) which started yesterday. States today he feels improved. No shortness of breath. Cough was productive of small amount of mucus. No sore throat or ear pain. No gi symptoms. His wife is currently in the hospital with covid-19. He was around her last two days ago. He tested negative for covid three days ago. He is not vaccinated for covid. History of afib and htn.    ROS per HPI, negative if not otherwise mentioned.      Past Medical History:  Diagnosis Date  . Atrial fibrillation [facility])     Patient Active Problem List   Diagnosis Date Noted  . Situational anxiety 01/25/2020  . DDD (degenerative disc disease), cervical 03/09/2019  . Paroxysmal atrial fibrillation (HCC) 02/08/2019  . History of atrial fibrillation 04/02/2018  . Headache 10/10/2015  . Hypertension 10/10/2015  . Premature atrial contractions 11/30/2013    History reviewed. No pertinent surgical history.     Home Medications    Prior to Admission medications   Medication Sig Start Date End Date Taking? Authorizing Provider  busPIRone (BUSPAR) 15 MG tablet Take 1 tablet (15 mg total) by mouth 2 (two) times daily. Patient taking differently: Take 15 mg by mouth daily. 07/19/20  Yes Malva Limes, MD  diltiazem (CARDIZEM CD) 240 MG 24 hr capsule Take 1 capsule (240 mg total) by mouth daily. 08/23/20  Yes Malva Limes, MD  potassium chloride SA (KLOR-CON) 20 MEQ tablet Take 20 mEq by mouth 2 (two) times daily. 02/10/20  Yes [provider]  aspirin-acetaminophen-caffeine (EXCEDRIN MIGRAINE) [phone] MG tablet Take 1 tablet by mouth every 6 (six) hours as  needed for headache.    [provider]  naproxen (NAPROSYN) 250 MG tablet Take 2 tablets (500 mg total) by mouth 2 (two) times daily with a meal. 04/27/20   Triplett, Kasandra Knudsen, FNP    Family History Family History  Problem Relation Age of Onset  . Hypertension Father   . Prostate cancer Father   . Prostate cancer Paternal Grandfather     Social History Social History   Tobacco Use  . Smoking status: Never Smoker  . Smokeless tobacco: Never Used  Vaping Use  . Vaping Use: Never used  Substance Use Topics  . Alcohol use: No  . Drug use: No     Allergies   Patient has no known allergies.   Review of Systems Review of Systems   Physical Exam Triage Vital Signs ED Triage Vitals  Enc Vitals Group     BP 12/03/20 1502 (!) 152/112     Pulse Rate 12/03/20 1502 91     Resp 12/03/20 1502 16     Temp 12/03/20 1502 98.7 F (37.1 C)     Temp Source 12/03/20 1502 Oral     SpO2 12/03/20 1502 100 %     Weight 12/03/20 1500 158 lb (71.7 kg)     Height 12/03/20 1500 5\' 11"  (1.803 m)     Head Circumference --      Peak Flow --      Pain Score 12/03/20 1459 3  Pain Loc --      Pain Edu? --      Excl. in GC? --    No data found.  Updated Vital Signs BP (!) 152/112 (BP Location: Left Arm)   Pulse 91   Temp 98.7 F (37.1 C) (Oral)   Resp 16   Ht 5\' 11"  (1.803 m)   Wt 158 lb (71.7 kg)   SpO2 100%   BMI 22.04 kg/m   Visual Acuity Right Eye Distance:   Left Eye Distance:   Bilateral Distance:    Right Eye Near:   Left Eye Near:    Bilateral Near:     Physical Exam Constitutional:      Appearance: He is well-developed.  Cardiovascular:     Rate and Rhythm: Normal rate.  Pulmonary:     Effort: Pulmonary effort is normal.  Skin:    General: Skin is warm and dry.  Neurological:     Mental Status: He is alert and oriented to person, place, and time.      UC Treatments / Results  Labs (all labs ordered are listed, but only abnormal results are  displayed) Labs Reviewed  RESP PANEL BY RT-PCR (FLU A&B, COVID) ARPGX2 - Abnormal; Notable for the following components:      Result Value   SARS Coronavirus 2 by RT PCR POSITIVE (*)    All other components within normal limits    EKG   Radiology No results found.  Procedures Procedures (including critical care time)  Medications Ordered in UC Medications - No data to display  Initial Impression / Assessment and Plan / UC Course  I have reviewed the triage vital signs and the nursing notes.  Pertinent labs & imaging results that were available during my care of the patient were reviewed by me and considered in my medical decision making (see chart for details).     Positive covid-19, high suspicion based on exposure and symptoms. Vitals stable today, no work of breathing. History of htn and afib. Referred by secure message to [facility] infusion center. Return precautions provided. Patient verbalized understanding and agreeable to plan.   Final Clinical Impressions(s) / UC Diagnoses   Final diagnoses:  Upper respiratory tract infection, unspecified type  Suspected COVID-19 virus infection  Close exposure to COVID-19 virus     Discharge Instructions     Isolate for 10 days from onset of symptoms.  Push fluids to ensure adequate hydration and keep secretions thin.  Tylenol and/or ibuprofen as needed for pain or fevers.  Over the counter medications as needed for symptoms.  Rest.  You may try some vitamins to help your immune system potentially:  Vitamin C 500mg  twice a day. Zinc 50mg  daily. Vitamin D 5000IU daily.   I would expect improvement by end of quarantine but cough, as well as loss of taste or smell can linger longer than that.  Please return for any worsening of symptoms- chest pain , difficulty breathing, uncontrolled fevers, or otherwise concerned.    ED Prescriptions    None     PDMP not reviewed this encounter.   , NP 12/03/20 [phone]

## 2020-12-03 NOTE — Telephone Encounter (Signed)
Tried calling patient to left him know that his COVID test was positive but there was no answer and his voice message was full and could not leave a message.

## 2020-12-03 NOTE — ED Triage Notes (Signed)
Patient states that he has had a cough, headache, and low grade fever that started yesterday.  Patient states that his wife has COVID.

## 2020-12-03 NOTE — Discharge Instructions (Addendum)
Isolate for 10 days from onset of symptoms.  Push fluids to ensure adequate hydration and keep secretions thin.  Tylenol and/or ibuprofen as needed for pain or fevers.  Over the counter medications as needed for symptoms.  Rest.  You may try some vitamins to help your immune system potentially:  Vitamin C 500mg  twice a day. Zinc 50mg  daily. Vitamin D 5000IU daily.   I would expect improvement by end of quarantine but cough, as well as loss of taste or smell can linger longer than that.  Please return for any worsening of symptoms- chest pain , difficulty breathing, uncontrolled fevers, or otherwise concerned.

## 2020-12-04 ENCOUNTER — Encounter: Payer: Self-pay | Admitting: Oncology

## 2020-12-04 ENCOUNTER — Telehealth (HOSPITAL_COMMUNITY): Payer: Self-pay | Admitting: *Deleted

## 2020-12-04 ENCOUNTER — Telehealth: Payer: Self-pay | Admitting: Oncology

## 2020-12-04 ENCOUNTER — Encounter (HOSPITAL_COMMUNITY): Payer: Self-pay | Admitting: *Deleted

## 2020-12-04 NOTE — Telephone Encounter (Signed)
Re: [facility]  Called to Discuss with patient about Covid symptoms and the use of regeneron, a monoclonal antibody infusion for those with mild to moderate Covid symptoms and at a high risk of hospitalization.     Pt is qualified for this infusion at the [facility] infusion center due to co-morbid conditions and/or a member of an at-risk group.    Past Medical History:  Diagnosis Date  . Atrial fibrillation (HCC)     Specific risk condition-A-Fib    Unable to reach pt. Left MCM. VM full.   Mignon Pine, AGNP-C [phone] (Infusion Center Hotline)

## 2020-12-04 NOTE — Telephone Encounter (Signed)
Called to Discuss with patient about Covid symptoms and the use of the monoclonal antibody infusion for those with mild to moderate Covid symptoms and at a high risk of hospitalization.     Pt appears to qualify for this infusion due to co-morbid conditions and/or a member of an at-risk group in accordance with the FDA Emergency Use Authorization.    Pt started having symptoms 12/02/20 and tested positive at [facility] on 12/03/20. Symptoms include coughing, sneezing, and headache.   Pt has a history of CHTN and Atrial Fibrillation.   Pt informed of the estimated costs of the treatment. Billing codes will be sent in a MyChart message.   Pt informed he will be contacted by an APP.

## 2020-12-05 DIAGNOSIS — U071 COVID-19: Secondary | ICD-10-CM | POA: Diagnosis not present

## 2020-12-27 DIAGNOSIS — R079 Chest pain, unspecified: Secondary | ICD-10-CM | POA: Diagnosis not present

## 2021-01-10 ENCOUNTER — Ambulatory Visit: Payer: BC Managed Care – PPO | Admitting: Family Medicine

## 2021-02-08 ENCOUNTER — Other Ambulatory Visit: Payer: Self-pay

## 2021-02-08 ENCOUNTER — Ambulatory Visit: Payer: BC Managed Care – PPO | Admitting: Family Medicine

## 2021-02-08 ENCOUNTER — Encounter: Payer: Self-pay | Admitting: Family Medicine

## 2021-02-08 VITALS — BP 146/101 | HR 69 | Temp 98.3°F | Ht 71.0 in | Wt 167.5 lb

## 2021-02-08 DIAGNOSIS — I480 Paroxysmal atrial fibrillation: Secondary | ICD-10-CM

## 2021-02-08 DIAGNOSIS — E876 Hypokalemia: Secondary | ICD-10-CM

## 2021-02-08 DIAGNOSIS — I10 Essential (primary) hypertension: Secondary | ICD-10-CM

## 2021-02-08 DIAGNOSIS — F418 Other specified anxiety disorders: Secondary | ICD-10-CM

## 2021-02-08 MED ORDER — LISINOPRIL 10 MG PO TABS: 10.0000 mg | ORAL_TABLET | Freq: Every day | ORAL | 3 refills | Status: DC

## 2021-02-08 NOTE — Progress Notes (Signed)
Established patient visit   Patient: Peter Wyatt   DOB: 09-Feb-1966   55 y.o. Male  MRN: [id number] Visit Date: 02/08/2021  Today's healthcare provider: Shirlee Latch, MD   Chief Complaint  Patient presents with  . Hypertension   Subjective    HPI HPI    Hypertension    This is a new problem.  Recent episode started 1 to 4 weeks ago.  The problem is uncontrolled.  Agents associated with hypertension include no associated agents.  Anxiety: Present.  Blurred vision: Absent.  Chest pain: Absent.  Chest pressure/discomfort: Present.  Dyspnea: Absent.  Headaches: Present (When bp elevates ).  Lower extremity edema: Absent.  Orthopnea: Absent.  Palpitations: Present.  Paroxysmal nocturnal dyspnea: Absent.  Syncope: Absent.  The patient exercises daily (Work is his exercise).  Patient's diet is generally healthy.       Last edited by Peter Wyatt, CMA on 02/08/2021  8:56 AM. (History)      Pt would like to discuss hypertension and work related stress.   Previously on lisinopril-hctz 10-12.5 daily. Stopped when started diltiazem.  Social History   Tobacco Use  . Smoking status: Never Smoker  . Smokeless tobacco: Never Used  Vaping Use  . Vaping Use: Never used  Substance Use Topics  . Alcohol use: No  . Drug use: No       Medications: Outpatient Medications Prior to Visit  Medication Sig  . aspirin-acetaminophen-caffeine (EXCEDRIN MIGRAINE) 250-250-65 MG tablet Take 1 tablet by mouth every 6 (six) hours as needed for headache.  . busPIRone (BUSPAR) 15 MG tablet Take 1 tablet (15 mg total) by mouth 2 (two) times daily. (Patient taking differently: Take 15 mg by mouth daily.)  . diltiazem (CARDIZEM CD) 240 MG 24 hr capsule Take 1 capsule (240 mg total) by mouth daily.  . naproxen (NAPROSYN) 250 MG tablet Take 2 tablets (500 mg total) by mouth 2 (two) times daily with a meal.  . potassium chloride SA (KLOR-CON) 20 MEQ tablet Take 20 mEq by mouth 2 (two) times  daily.   No facility-administered medications prior to visit.    Review of Systems  Constitutional: Negative.   Respiratory: Negative.   Cardiovascular: Negative.   Musculoskeletal: Negative.   Neurological: Negative.        Objective    BP (!) 146/101 (BP Location: Left Arm, Patient Position: Sitting, Cuff Size: Large)   Pulse 69   Temp 98.3 F (36.8 C) (Oral)   Ht 5\' 11"  (1.803 m)   Wt 167 lb 8 oz (76 kg)   BMI 23.36 kg/m      Physical Exam Constitutional:      General: He is not in acute distress.    Appearance: Normal appearance.  HENT:     Head: Normocephalic and atraumatic.  Eyes:     General: No scleral icterus.    Conjunctiva/sclera: Conjunctivae normal.  Cardiovascular:     Rate and Rhythm: Normal rate and regular rhythm.     Pulses: Normal pulses.     Heart sounds: Normal heart sounds. No murmur heard.   Pulmonary:     Effort: Pulmonary effort is normal. No respiratory distress.     Breath sounds: Normal breath sounds. No wheezing.  Musculoskeletal:     Right lower leg: No edema.     Left lower leg: No edema.  Neurological:     Mental Status: He is alert. Mental status is at baseline.  Psychiatric:  Mood and Affect: Mood normal.        Behavior: Behavior normal.       No results found for any visits on 02/08/21.  Assessment & Plan     Problem List Items Addressed This Visit      Cardiovascular and Mediastinum   Hypertension - Primary    Chronic and uncontrolled Discussed that stress is contributing, but it is constantly elevated at this time Resume lisinopril 10mg  daily in addition to diltiazem Recheck BMP in 1 wk to recheck potassium As below, may be able to d/c or decrease KDur dose on lisinopril as he did not have hypokalemia when he was on this previously F/u in 1 month      Relevant Medications   lisinopril (ZESTRIL) 10 MG tablet   Other Relevant Orders   Basic Metabolic Panel (BMET)   Paroxysmal atrial fibrillation  (HCC)    In NSR today Continue diltiazem for rate control Also followed by cardiology      Relevant Medications   lisinopril (ZESTRIL) 10 MG tablet     Other   Situational anxiety    Worsening recently Resume previous dose of Buspar 15mg  BID (had self-decreased to daily dosing)       Other Visit Diagnoses    Hypokalemia       Relevant Orders   Basic Metabolic Panel (BMET)       Return in about 4 weeks (around 03/08/2021) for BP f/u.      I, , MD, have reviewed all documentation for this visit. The documentation on 02/08/21 for the exam, diagnosis, procedures, and orders are all accurate and complete.   Peter Wyatt, Peter Latch, MD, MPH [facility]

## 2021-02-08 NOTE — Assessment & Plan Note (Signed)
Chronic and uncontrolled Discussed that stress is contributing, but it is constantly elevated at this time Resume lisinopril 10mg  daily in addition to diltiazem Recheck BMP in 1 wk to recheck potassium As below, may be able to d/c or decrease KDur dose on lisinopril as he did not have hypokalemia when he was on this previously F/u in 1 month

## 2021-02-08 NOTE — Assessment & Plan Note (Signed)
In NSR today Continue diltiazem for rate control Also followed by cardiology

## 2021-02-08 NOTE — Assessment & Plan Note (Signed)
Worsening recently Resume previous dose of Buspar 15mg  BID (had self-decreased to daily dosing)

## 2021-02-08 NOTE — Patient Instructions (Signed)
Add lisinopril to diltiazem Check labs in 1 week and will reconsider potassium

## 2021-02-20 DIAGNOSIS — I10 Essential (primary) hypertension: Secondary | ICD-10-CM | POA: Diagnosis not present

## 2021-02-20 DIAGNOSIS — G5602 Carpal tunnel syndrome, left upper limb: Secondary | ICD-10-CM | POA: Diagnosis not present

## 2021-02-20 DIAGNOSIS — E876 Hypokalemia: Secondary | ICD-10-CM | POA: Diagnosis not present

## 2021-02-21 LAB — BASIC METABOLIC PANEL
BUN/Creatinine Ratio: 16 (ref 9–20)
BUN: 15 mg/dL (ref 6–24)
CO2: 25 mmol/L (ref 20–29)
Calcium: 9.8 mg/dL (ref 8.7–10.2)
Chloride: 103 mmol/L (ref 96–106)
Creatinine, Ser: 0.93 mg/dL (ref 0.76–1.27)
Glucose: 88 mg/dL (ref 65–99)
Potassium: 4.1 mmol/L (ref 3.5–5.2)
Sodium: 142 mmol/L (ref 134–144)
eGFR: 98 mL/min/{1.73_m2} (ref >59)

## 2021-02-22 ENCOUNTER — Other Ambulatory Visit: Payer: Self-pay | Admitting: Family Medicine

## 2021-02-22 DIAGNOSIS — I480 Paroxysmal atrial fibrillation: Secondary | ICD-10-CM

## 2021-02-23 ENCOUNTER — Ambulatory Visit: Payer: Self-pay | Admitting: Urology

## 2021-03-06 DIAGNOSIS — G5602 Carpal tunnel syndrome, left upper limb: Secondary | ICD-10-CM | POA: Diagnosis not present

## 2021-03-12 ENCOUNTER — Ambulatory Visit: Payer: Self-pay | Admitting: Family Medicine

## 2021-03-12 NOTE — Progress Notes (Deleted)
      Established patient visit   Patient: Peter Wyatt   DOB: 1966-09-11   55 y.o. Male  MRN: [id number] Visit Date: 03/12/2021  Today's healthcare provider: [name], MD   No chief complaint on file.  Subjective    HPI  Hypertension, follow-up  BP Readings from Last 3 Encounters:  02/08/21 (!) 146/101  12/03/20 (!) 152/112  09/25/20 139/90   Wt Readings from Last 3 Encounters:  02/08/21 167 lb 8 oz (76 kg)  12/03/20 158 lb (71.7 kg)  09/25/20 160 lb (72.6 kg)     He was last seen for hypertension 1 months ago.  BP at that visit was 146/101. Management since that visit includes; Resume lisinopril 10mg  daily in addition to diltiazem. Recheck BMP in 1 wk to recheck potassium. As below, may be able to d/c or decrease KDur dose on lisinopril as he did not have hypokalemia when he was on this previously.  He reports {excellent/good/fair/poor:19665} compliance with treatment. He {is/is not:9024} having side effects. {document side effects if present:1} He is following a {diet:21022986} diet. He {is/is not:9024} exercising. He {does/does not:200015} smoke.  Use of agents associated with hypertension: {bp agents assoc with hypertension:511::"none"}.   Outside blood pressures are {***enter patient reported home BP readings, or 'not being checked':1}. Symptoms: {Yes/No:20286} chest pain {Yes/No:20286} chest pressure  {Yes/No:20286} palpitations {Yes/No:20286} syncope  {Yes/No:20286} dyspnea {Yes/No:20286} orthopnea  {Yes/No:20286} paroxysmal nocturnal dyspnea {Yes/No:20286} lower extremity edema   Pertinent labs: Lab Results  Component Value Date   CHOL 130 06/29/2019   HDL 57 06/29/2019   LDLCALC 61 06/29/2019   TRIG 59 06/29/2019   CHOLHDL 2.3 06/29/2019   Lab Results  Component Value Date   NA 142 02/20/2021   K 4.1 02/20/2021   CREATININE 0.93 02/20/2021   GFRNONAA 101 09/01/2020   GFRAA 117 09/01/2020   GLUCOSE 88 02/20/2021     The 10-year ASCVD  risk score 04/22/2021 DC Jr., et al., 2013) is: 3.5%   ---------------------------------------------------------------------------------------------------   {Show patient history (optional):23778::" "}   Medications: Outpatient Medications Prior to Visit  Medication Sig  . aspirin-acetaminophen-caffeine (EXCEDRIN MIGRAINE) 250-250-65 MG tablet Take 1 tablet by mouth every 6 (six) hours as needed for headache.  . busPIRone (BUSPAR) 15 MG tablet Take 1 tablet (15 mg total) by mouth 2 (two) times daily. (Patient taking differently: Take 15 mg by mouth daily.)  . diltiazem (CARDIZEM CD) 240 MG 24 hr capsule Take 1 capsule by mouth once daily  . lisinopril (ZESTRIL) 10 MG tablet Take 1 tablet (10 mg total) by mouth daily.  . naproxen (NAPROSYN) 250 MG tablet Take 2 tablets (500 mg total) by mouth 2 (two) times daily with a meal.  . potassium chloride SA (KLOR-CON) 20 MEQ tablet Take 20 mEq by mouth 2 (two) times daily.   No facility-administered medications prior to visit.    Review of Systems  {Labs  Heme  Chem  Endocrine  Serology  Results Review (optional):23779::" "}   Objective    There were no vitals taken for this visit. {Show previous vital signs (optional):23777::" "}   Physical Exam  ***  No results found for any visits on 03/12/21.  Assessment & Plan     ***  No follow-ups on file.      {provider attestation***:1}   03/14/21, MD  [facility] [phone] (phone) [phone] (fax)  [facility]

## 2021-04-13 ENCOUNTER — Encounter: Payer: Self-pay | Admitting: Urology

## 2021-04-13 ENCOUNTER — Ambulatory Visit: Payer: BC Managed Care – PPO | Admitting: Urology

## 2021-04-13 ENCOUNTER — Other Ambulatory Visit: Payer: Self-pay

## 2021-04-13 VITALS — BP 144/90 | HR 65 | Ht 71.0 in | Wt 162.0 lb

## 2021-04-13 DIAGNOSIS — N401 Enlarged prostate with lower urinary tract symptoms: Secondary | ICD-10-CM | POA: Diagnosis not present

## 2021-04-13 DIAGNOSIS — R361 Hematospermia: Secondary | ICD-10-CM | POA: Diagnosis not present

## 2021-04-13 LAB — URINALYSIS, COMPLETE
Bilirubin, UA: NEGATIVE
Glucose, UA: NEGATIVE
Ketones, UA: NEGATIVE
Leukocytes,UA: NEGATIVE
Nitrite, UA: NEGATIVE
Protein,UA: NEGATIVE
Specific Gravity, UA: 1.015 (ref 1.005–1.030)
Urobilinogen, Ur: 0.2 mg/dL (ref 0.2–1.0)
pH, UA: 5.5 (ref 5.0–7.5)

## 2021-04-13 LAB — MICROSCOPIC EXAMINATION
Bacteria, UA: NONE SEEN
Epithelial Cells (non renal): NONE SEEN /hpf (ref 0–10)

## 2021-04-13 NOTE — Progress Notes (Signed)
   04/13/2021 8:17 AM   Peter Wyatt 05/29/1966 [id number]  Referring provider: Malva Limes, MD [address] [place],  [place] [address]  No chief complaint on file.   HPI: 55 y.o. male presents for follow-up.   Saw Dr. Mena Goes 02/25/2020 for hematospermia  PSA 0.5 and benign DRE and felt secondary to BPH; monitoring recommended  Since his visit with Dr. Mena Goes last year he has been doing well and denies recurrent hematospermia.  He has urinary frequency but attributes this to significant water intake  PSA September 2021 stable at 1.0   PMH: Past Medical History:  Diagnosis Date  . Atrial fibrillation [facility])     Surgical History: No past surgical history on file.  Home Medications:  Allergies as of 04/13/2021   No Known Allergies     Medication List       Accurate as of April 13, 2021  8:17 AM. If you have any questions, ask your nurse or doctor.        aspirin-acetaminophen-caffeine 250-250-65 MG tablet Commonly known as: EXCEDRIN MIGRAINE Take 1 tablet by mouth every 6 (six) hours as needed for headache.   busPIRone 15 MG tablet Commonly known as: BUSPAR Take 1 tablet (15 mg total) by mouth 2 (two) times daily. What changed: when to take this   diltiazem 240 MG 24 hr capsule Commonly known as: CARDIZEM CD Take 1 capsule by mouth once daily   lisinopril 10 MG tablet Commonly known as: ZESTRIL Take 1 tablet (10 mg total) by mouth daily.   naproxen 250 MG tablet Commonly known as: NAPROSYN Take 2 tablets (500 mg total) by mouth 2 (two) times daily with a meal.   potassium chloride SA 20 MEQ tablet Commonly known as: KLOR-CON Take 20 mEq by mouth 2 (two) times daily.       Allergies: No Known Allergies  Family History: Family History  Problem Relation Age of Onset  . Hypertension Father   . Prostate cancer Father   . Prostate cancer Paternal Grandfather     Social History:  reports that he has never smoked. He has  never used smokeless tobacco. He reports that he does not drink alcohol and does not use drugs.   Physical Exam: There were no vitals taken for this visit.  Constitutional:  Alert and oriented, No acute distress. HEENT: [place] AT, moist mucus membranes.  Trachea midline, no masses. Cardiovascular: No clubbing, cyanosis, or edema. Respiratory: Normal respiratory effort, no increased work of breathing. GU: Prostate 50 g, smooth without nodules Neurologic: Grossly intact, no focal deficits, moving all 4 extremities. Psychiatric: Normal mood and affect.   Assessment & Plan:    1.  BPH with mild LUTS  Symptoms not bothersome  2.  Hematospermia  Resolved  3.  Prostate cancer screening  Follow-up lab visit October 2022 for PSA    If PSA stable will see for office visit October 2023 unless he develops worsening voiding symptoms or recurrent hematospermia   [name], MD  [facility] [address] [place], [place] [address] [phone]

## 2021-04-30 DIAGNOSIS — G5732 Lesion of lateral popliteal nerve, left lower limb: Secondary | ICD-10-CM | POA: Diagnosis not present

## 2021-04-30 DIAGNOSIS — G5603 Carpal tunnel syndrome, bilateral upper limbs: Secondary | ICD-10-CM | POA: Diagnosis not present

## 2021-05-25 ENCOUNTER — Other Ambulatory Visit: Payer: Self-pay | Admitting: Family Medicine

## 2021-05-25 DIAGNOSIS — I480 Paroxysmal atrial fibrillation: Secondary | ICD-10-CM

## 2021-06-04 DIAGNOSIS — G5603 Carpal tunnel syndrome, bilateral upper limbs: Secondary | ICD-10-CM | POA: Diagnosis not present

## 2021-06-20 DIAGNOSIS — G5601 Carpal tunnel syndrome, right upper limb: Secondary | ICD-10-CM | POA: Diagnosis not present

## 2021-06-28 DIAGNOSIS — I480 Paroxysmal atrial fibrillation: Secondary | ICD-10-CM | POA: Diagnosis not present

## 2021-06-28 DIAGNOSIS — I10 Essential (primary) hypertension: Secondary | ICD-10-CM | POA: Diagnosis not present

## 2021-06-28 DIAGNOSIS — R002 Palpitations: Secondary | ICD-10-CM | POA: Diagnosis not present

## 2021-06-28 DIAGNOSIS — Z8679 Personal history of other diseases of the circulatory system: Secondary | ICD-10-CM | POA: Diagnosis not present

## 2021-07-24 DIAGNOSIS — L578 Other skin changes due to chronic exposure to nonionizing radiation: Secondary | ICD-10-CM | POA: Diagnosis not present

## 2021-07-24 DIAGNOSIS — L282 Other prurigo: Secondary | ICD-10-CM | POA: Diagnosis not present

## 2021-07-24 DIAGNOSIS — L570 Actinic keratosis: Secondary | ICD-10-CM | POA: Diagnosis not present

## 2021-07-24 DIAGNOSIS — D225 Melanocytic nevi of trunk: Secondary | ICD-10-CM | POA: Diagnosis not present

## 2021-08-21 ENCOUNTER — Other Ambulatory Visit: Payer: Self-pay | Admitting: Family Medicine

## 2021-08-21 DIAGNOSIS — I480 Paroxysmal atrial fibrillation: Secondary | ICD-10-CM

## 2021-08-22 NOTE — Telephone Encounter (Signed)
Requested medication (s) are due for refill today: Yes  Requested medication (s) are on the active medication list: Yes  Last refill:  05/25/21  Future visit scheduled: No  Notes to clinic:  Pt. Needs appointment. Voice mailbox full, unable to leave message to schedule an appointment.    Requested Prescriptions  Pending Prescriptions Disp Refills   diltiazem (CARDIZEM CD) 240 MG 24 hr capsule [Pharmacy Med Name: dilTIAZem HCl ER Coated Beads 240 MG Oral Capsule Extended Release 24 Hour] 90 capsule 0    Sig: Take 1 capsule by mouth once daily     Cardiovascular:  Calcium Channel Blockers Failed - 08/22/2021  3:24 AM      Failed - Last BP in normal range    BP Readings from Last 1 Encounters:  04/13/21 (!) 144/90          Failed - Valid encounter within last 6 months    Recent Outpatient Visits           6 months ago Primary hypertension   [facility], Marzella Schlein, MD   11 months ago Annual physical exam   [facility] Malva Limes, MD   1 year ago Essential hypertension   [facility] Malva Limes, MD   1 year ago Hemospermia   [facility] Malva Limes, MD   1 year ago Situational anxiety   [facility] Sherrie Mustache, Demetrios Isaacs, MD

## 2021-08-24 ENCOUNTER — Other Ambulatory Visit: Payer: Self-pay | Admitting: Family Medicine

## 2021-08-24 DIAGNOSIS — F418 Other specified anxiety disorders: Secondary | ICD-10-CM

## 2021-08-24 NOTE — Telephone Encounter (Signed)
Requested medication (s) are due for refill today: expired medication  Requested medication (s) are on the active medication list: yes   Last refill:  07/19/20 #180 4 refills   Future visit scheduled: no   Notes to clinic:  expired medication, no valid encounter. Wife reports patient will be out of med by 08/26/21. Called patient to schedule appt. No answer. Unable to leave voicemail , mailbox full. Do you want courtesy refill?     Requested Prescriptions  Pending Prescriptions Disp Refills   busPIRone (BUSPAR) 15 MG tablet [Pharmacy Med Name: busPIRone HCl 15 MG Oral Tablet] 180 tablet 0    Sig: Take 1 tablet by mouth twice daily     Psychiatry: Anxiolytics/Hypnotics - Non-controlled Failed - 08/24/2021  9:21 AM      Failed - Valid encounter within last 6 months    Recent Outpatient Visits           6 months ago Primary hypertension   [facility], Marzella Schlein, MD   11 months ago Annual physical exam   [facility] Malva Limes, MD   1 year ago Essential hypertension   [facility] Malva Limes, MD   1 year ago Hemospermia   [facility] Malva Limes, MD   1 year ago Situational anxiety   [facility] Sherrie Mustache, Demetrios Isaacs, MD

## 2021-08-24 NOTE — Telephone Encounter (Signed)
Pts wife called stating that the pt will be out of this medication by 08/26/21. She is requesting to have this expedited. Please advise.

## 2021-09-27 ENCOUNTER — Other Ambulatory Visit: Payer: Self-pay

## 2021-10-02 ENCOUNTER — Other Ambulatory Visit: Payer: Self-pay | Admitting: Family Medicine

## 2021-10-02 DIAGNOSIS — F418 Other specified anxiety disorders: Secondary | ICD-10-CM

## 2021-10-07 ENCOUNTER — Other Ambulatory Visit: Payer: Self-pay | Admitting: Family Medicine

## 2021-10-07 DIAGNOSIS — F418 Other specified anxiety disorders: Secondary | ICD-10-CM

## 2021-10-07 NOTE — Telephone Encounter (Signed)
Attempted to call pt to make an appointment. Called but VM full so unable to leave a message. Last RF 08/24/21 # 60.   Requested Prescriptions  Pending Prescriptions Disp Refills   busPIRone (BUSPAR) 15 MG tablet [Pharmacy Med Name: busPIRone HCl 15 MG Oral Tablet] 60 tablet 0    Sig: Take 1 tablet by mouth twice daily     Psychiatry: Anxiolytics/Hypnotics - Non-controlled Failed - 10/07/2021  3:46 PM      Failed - Valid encounter within last 6 months    Recent Outpatient Visits           8 months ago Primary hypertension   [facility] [place], Marzella Schlein, MD   1 year ago Annual physical exam   [facility] Malva Limes, MD   1 year ago Essential hypertension   [facility] Malva Limes, MD   1 year ago Hemospermia   [facility] Malva Limes, MD   2 years ago Situational anxiety   [facility] Sherrie Mustache, Demetrios Isaacs, MD

## 2021-10-12 MED ORDER — BUSPIRONE HCL 15 MG PO TABS: 15.0000 mg | ORAL_TABLET | Freq: Two times a day (BID) | ORAL | 0 refills | Status: DC

## 2021-10-12 NOTE — Telephone Encounter (Signed)
Patient is scheduled for 10/23/21. Can enough be sent in until appointment. Please advise

## 2021-10-12 NOTE — Addendum Note (Signed)
Addended by: Wilford Corner on: 10/12/2021 11:47 AM   Modules accepted: Orders

## 2021-10-12 NOTE — Telephone Encounter (Signed)
Courtesy refill given, appointment scheduled for 10/23/21

## 2021-10-23 ENCOUNTER — Telehealth (INDEPENDENT_AMBULATORY_CARE_PROVIDER_SITE_OTHER): Payer: BC Managed Care – PPO | Admitting: Family Medicine

## 2021-10-23 DIAGNOSIS — F418 Other specified anxiety disorders: Secondary | ICD-10-CM | POA: Diagnosis not present

## 2021-10-23 DIAGNOSIS — I10 Essential (primary) hypertension: Secondary | ICD-10-CM | POA: Diagnosis not present

## 2021-10-23 MED ORDER — BUSPIRONE HCL 15 MG PO TABS: 7.5000 mg | ORAL_TABLET | Freq: Two times a day (BID) | ORAL | 0 refills | Status: DC

## 2021-10-23 MED ORDER — LISINOPRIL 10 MG PO TABS: 10.0000 mg | ORAL_TABLET | Freq: Every day | ORAL | 0 refills | Status: DC

## 2021-10-23 NOTE — Progress Notes (Signed)
MyChart Video Visit    Virtual Visit via Video Note   This visit type was conducted due to national recommendations for restrictions regarding the COVID-19 Pandemic (e.g. social distancing) in an effort to limit this patient's exposure and mitigate transmission in our community. This patient is at least at moderate risk for complications without adequate follow up. This format is felt to be most appropriate for this patient at this time. Physical exam was limited by quality of the video and audio technology used for the visit.   Patient location: home Provider location: [facility]   I discussed the limitations of evaluation and management by telemedicine and the availability of in person appointments. The patient expressed understanding and agreed to proceed.  Patient: Peter Wyatt   DOB: 22-Sep-1966   55 y.o. Male  MRN: [id number] Visit Date: 10/23/2021  Today's healthcare provider: Lelon Huh, MD   No chief complaint on file.  Subjective    HPI  Situational Anxiety, Follow-up  He was last seen for anxiety 8 months ago (seen by Dr. Jacinto Reap). Changes made at last visit include resuming previous dose of Buspar 34m BID  He reports excellent compliance with treatment. He reports excellent tolerance of treatment. He is not having side effects.   He feels his anxiety is Improved since last visit.  Symptoms: No chest pain No difficulty concentrating  No dizziness No fatigue  No feelings of losing control No insomnia  No irritable No palpitations  No panic attacks No racing thoughts  No shortness of breath No sweating  No tremors/shakes    GAD-7 Results No flowsheet data found.  PHQ-9 Scores PHQ9 SCORE ONLY 02/08/2021 09/01/2020 01/07/2018  PHQ-9 Total Score 2 2 0    ---------------------------------------------------------------------------------------------------   Hypertension, follow-up  BP Readings from Last 3 Encounters:  04/13/21 (!) 144/90  02/08/21 (!) 146/101   12/03/20 (!) 152/112   Wt Readings from Last 3 Encounters:  04/13/21 162 lb (73.5 kg)  02/08/21 167 lb 8 oz (76 kg)  12/03/20 158 lb (71.7 kg)     He was last seen for hypertension 8 months ago.  BP at that visit was 144/90. Management since that visit includes resuming lisinopril 170mdaily in addition to diltiazem. Recheck BMP in 1 wk to recheck potassium.  He reports excellent compliance with treatment. He is not having side effects.  He is following a Regular diet. He is exercising. He does not smoke.  Use of agents associated with hypertension: none.   Outside blood pressures are normal home. BP today was 126 /72  Symptoms: No chest pain No chest pressure  No palpitations No syncope  No dyspnea No orthopnea  No paroxysmal nocturnal dyspnea No lower extremity edema   Pertinent labs: Lab Results  Component Value Date   CHOL 130 06/29/2019   HDL 57 06/29/2019   LDLCALC 61 06/29/2019   TRIG 59 06/29/2019   CHOLHDL 2.3 06/29/2019   Lab Results  Component Value Date   NA 142 02/20/2021   K 4.1 02/20/2021   CREATININE 0.93 02/20/2021   EGFR 98 02/20/2021   GLUCOSE 88 02/20/2021     The 10-year ASCVD risk score (Arnett DK, et al., 2019) is: 3%   ---------------------------------------------------------------------------------------------------    Medications: Outpatient Medications Prior to Visit  Medication Sig   aspirin-acetaminophen-caffeine (EXCEDRIN MIGRAINE) 250-250-65 MG tablet Take 1 tablet by mouth every 6 (six) hours as needed for headache.   busPIRone (BUSPAR) 15 MG tablet Take 1 tablet (15 mg total)  by mouth 2 (two) times daily.   diltiazem (CARDIZEM CD) 240 MG 24 hr capsule Take 1 capsule by mouth once daily   lisinopril (ZESTRIL) 10 MG tablet Take 1 tablet (10 mg total) by mouth daily.   naproxen (NAPROSYN) 250 MG tablet Take 2 tablets (500 mg total) by mouth 2 (two) times daily with a meal.   potassium chloride SA (KLOR-CON) 20 MEQ tablet Take 20  mEq by mouth 2 (two) times daily.   No facility-administered medications prior to visit.    Review of Systems  Constitutional: Negative.   Respiratory: Negative.    Cardiovascular: Negative.   Gastrointestinal: Negative.   Neurological:  Positive for headaches. Negative for dizziness, seizures, syncope, speech difficulty, light-headedness and numbness.  Psychiatric/Behavioral:  Negative for decreased concentration, dysphoric mood, self-injury, sleep disturbance and suicidal ideas. The patient is not nervous/anxious.      Objective    There were no vitals taken for this visit.  Physical Exam   Awake, alert, oriented x 3. In no apparent distress    Assessment & Plan     1. Situational anxiety Doing well on buspirone. He would like to try cutting back to 1/2 BID or 1 tablet daily. refill busPIRone (BUSPAR) 15 MG tablet; Take 0.5-1 tablets (7.5-15 mg total) by mouth 2 (two) times daily.  Dispense: 180 tablet; Refill: 0  2. Primary hypertension Much better home BP readings since restarting lisinopril. refill lisinopril (ZESTRIL) 10 MG tablet; Take 1 tablet (10 mg total) by mouth daily.  Dispense: 90 tablet; Refill: 0   Future Appointments  Date Time Provider [place]  12/31/2021 10:00 AM Anyae Griffith, Kirstie Peri, MD [facility]       I discussed the assessment and treatment plan with the patient. The patient was provided an opportunity to ask questions and all were answered. The patient agreed with the plan and demonstrated an understanding of the instructions.   The patient was advised to call back or seek an in-person evaluation if the symptoms worsen or if the condition fails to improve as anticipated.  Video connection was lost when less than 50% of the duration of the visit was complete, at which time the remainder of the visit was completed via audio only.   I provided 13 minutes of non-face-to-face time during this encounter.  The entirety of the information documented  in the History of Present Illness, Review of Systems and Physical Exam were personally obtained by me. Portions of this information were initially documented by the CMA and reviewed by me for thoroughness and accuracy.    Lelon Huh, MD [facility] [phone] (phone) [phone] (fax)  [place]

## 2021-11-13 ENCOUNTER — Other Ambulatory Visit: Payer: BC Managed Care – PPO

## 2021-11-13 ENCOUNTER — Other Ambulatory Visit: Payer: Self-pay

## 2021-11-13 DIAGNOSIS — N401 Enlarged prostate with lower urinary tract symptoms: Secondary | ICD-10-CM | POA: Diagnosis not present

## 2021-11-14 ENCOUNTER — Other Ambulatory Visit: Payer: Self-pay | Admitting: Urology

## 2021-11-14 ENCOUNTER — Telehealth: Payer: Self-pay | Admitting: *Deleted

## 2021-11-14 DIAGNOSIS — R361 Hematospermia: Secondary | ICD-10-CM

## 2021-11-14 DIAGNOSIS — R972 Elevated prostate specific antigen [PSA]: Secondary | ICD-10-CM

## 2021-11-14 LAB — PSA: Prostate Specific Ag, Serum: 1.5 ng/mL (ref 0.0–4.0)

## 2021-11-14 NOTE — Telephone Encounter (Signed)
-----   Message from [name], MD sent at 11/14/2021  7:43 AM EST ----- PSA has increased to 1.5.  Would recommend scheduling prostate MRI for further evaluation of his increasing PSA and blood in his semen.  Order was entered and will call with results.

## 2021-11-14 NOTE — Telephone Encounter (Signed)
Notified patient as instructed, patient pleased. Discussed follow-up appointments,   

## 2021-11-20 ENCOUNTER — Other Ambulatory Visit: Payer: Self-pay | Admitting: Family Medicine

## 2021-11-20 DIAGNOSIS — I480 Paroxysmal atrial fibrillation: Secondary | ICD-10-CM

## 2021-11-29 ENCOUNTER — Telehealth: Payer: Self-pay

## 2021-11-29 ENCOUNTER — Encounter: Payer: Self-pay | Admitting: *Deleted

## 2021-11-29 NOTE — Telephone Encounter (Signed)
DRE in April was negative and is not a very sensitive exam and diagnosing early prostate cancer.  MRI will give the most noninvasive information.  If he does not want to pursue MRI would recommend a 89-month follow-up for repeat PSA and DRE

## 2021-11-29 NOTE — Telephone Encounter (Signed)
Pt calls triage line he states that he is scheduled for a prostate MRI on Monday, the cost is $1100. He questions if he could have a DRE to determine if the MRI is necessary or if he should just proceed with imaging. Please advise.

## 2021-12-03 ENCOUNTER — Ambulatory Visit: Payer: BC Managed Care – PPO

## 2021-12-22 ENCOUNTER — Other Ambulatory Visit: Payer: Self-pay

## 2021-12-22 ENCOUNTER — Emergency Department: Payer: BC Managed Care – PPO

## 2021-12-22 ENCOUNTER — Emergency Department
Admission: EM | Admit: 2021-12-22 | Discharge: 2021-12-22 | Disposition: A | Discharge status: Home / Self Care | Payer: BC Managed Care – PPO | Attending: Emergency Medicine | Admitting: Emergency Medicine

## 2021-12-22 DIAGNOSIS — T2010XA Burn of first degree of head, face, and neck, unspecified site, initial encounter: Secondary | ICD-10-CM | POA: Diagnosis not present

## 2021-12-22 DIAGNOSIS — W401XXA Explosion of explosive gases, initial encounter: Secondary | ICD-10-CM | POA: Diagnosis not present

## 2021-12-22 DIAGNOSIS — S0993XA Unspecified injury of face, initial encounter: Secondary | ICD-10-CM | POA: Diagnosis not present

## 2021-12-22 DIAGNOSIS — X04XXXA Exposure to ignition of highly flammable material, initial encounter: Secondary | ICD-10-CM | POA: Insufficient documentation

## 2021-12-22 DIAGNOSIS — Y990 Civilian activity done for income or pay: Secondary | ICD-10-CM | POA: Insufficient documentation

## 2021-12-22 HISTORY — DX: Essential (primary) hypertension: I10

## 2021-12-22 LAB — CBC WITH DIFFERENTIAL/PLATELET
Abs Immature Granulocytes: 0.04 10*3/uL (ref 0.00–0.07)
Basophils Absolute: 0 10*3/uL (ref 0.0–0.1)
Basophils Relative: 0 %
Eosinophils Absolute: 0.1 10*3/uL (ref 0.0–0.5)
Eosinophils Relative: 1 %
HCT: 45.9 % (ref 39.0–52.0)
Hemoglobin: 16 g/dL (ref 13.0–17.0)
Immature Granulocytes: 1 %
Lymphocytes Relative: 22 %
Lymphs Abs: 1.7 10*3/uL (ref 0.7–4.0)
MCH: 29.9 pg (ref 26.0–34.0)
MCHC: 34.9 g/dL (ref 30.0–36.0)
MCV: 85.6 fL (ref 80.0–100.0)
Monocytes Absolute: 0.4 10*3/uL (ref 0.1–1.0)
Monocytes Relative: 6 %
Neutro Abs: 5.3 10*3/uL (ref 1.7–7.7)
Neutrophils Relative %: 70 %
Platelets: 172 10*3/uL (ref 150–400)
RBC: 5.36 MIL/uL (ref 4.22–5.81)
RDW: 11.9 % (ref 11.5–15.5)
WBC: 7.5 10*3/uL (ref 4.0–10.5)
nRBC: 0 % (ref 0.0–0.2)

## 2021-12-22 LAB — BASIC METABOLIC PANEL
Anion gap: 7 (ref 5–15)
BUN: 16 mg/dL (ref 6–20)
CO2: 27 mmol/L (ref 22–32)
Calcium: 9.1 mg/dL (ref 8.9–10.3)
Chloride: 104 mmol/L (ref 98–111)
Creatinine, Ser: 0.9 mg/dL (ref 0.61–1.24)
GFR, Estimated: >60 mL/min (ref >60)
Glucose, Bld: 228 mg/dL — ABNORMAL HIGH (ref 70–99)
Potassium: 3.6 mmol/L (ref 3.5–5.1)
Sodium: 138 mmol/L (ref 135–145)
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
## 2021-12-31 ENCOUNTER — Other Ambulatory Visit: Payer: Self-pay

## 2021-12-31 ENCOUNTER — Encounter: Payer: Self-pay | Admitting: Family Medicine

## 2021-12-31 ENCOUNTER — Ambulatory Visit (INDEPENDENT_AMBULATORY_CARE_PROVIDER_SITE_OTHER): Payer: BC Managed Care – PPO | Admitting: Family Medicine

## 2021-12-31 VITALS — BP 132/100 | HR 73 | Temp 98.2°F | Ht 71.0 in | Wt 175.0 lb

## 2021-12-31 DIAGNOSIS — Z1211 Encounter for screening for malignant neoplasm of colon: Secondary | ICD-10-CM

## 2021-12-31 DIAGNOSIS — Z0000 Encounter for general adult medical examination without abnormal findings: Secondary | ICD-10-CM

## 2021-12-31 DIAGNOSIS — Z23 Encounter for immunization: Secondary | ICD-10-CM

## 2021-12-31 DIAGNOSIS — I10 Essential (primary) hypertension: Secondary | ICD-10-CM

## 2021-12-31 MED ORDER — LISINOPRIL 20 MG PO TABS: 20.0000 mg | ORAL_TABLET | Freq: Every day | ORAL | 3 refills | Status: DC

## 2021-12-31 NOTE — Patient Instructions (Addendum)
Please review the attached list of medications and notify my office if there are any errors.   I strongly recommend a Covid bivalent (omicron) booster if you have not yet had one   Please contact your eyecare professional to schedule a routine eye exam

## 2021-12-31 NOTE — Progress Notes (Signed)
Complete physical exam   Patient: Peter Wyatt   DOB: 1966/01/26   56 y.o. Male  MRN: [id number] Visit Date: 12/31/2021  Today's healthcare provider: Lelon Huh, MD   Chief Complaint  Patient presents with   Annual Exam   Subjective    Peter Wyatt is a 56 y.o. male who presents today for a complete physical exam.  He reports consuming a general diet.  Exercises some.  He generally feels well. He reports sleeping well. He does not have additional problems to discuss today.  HPI  He is due for follow up hypertension. Is doing well with current medications. Continues to follow up with cardiology once a year for atrial fibrillation, but has maintained NSR on diltiazem. He has CDL medical exam next month. Is not checking blood pressures at home.   Past Medical History:  Diagnosis Date   Atrial fibrillation ([place])    Hypertension    History reviewed. No pertinent surgical history. Social History   Socioeconomic History   Marital status: Married    Spouse name: Not on file   Number of children: 2   Years of education: Not on file   Highest education level: Not on file  Occupational History   Occupation: Self Employed  Tobacco Use   Smoking status: Never   Smokeless tobacco: Never  Vaping Use   Vaping Use: Never used  Substance and Sexual Activity   Alcohol use: No   Drug use: No   Sexual activity: Not on file  Other Topics Concern   Not on file  Social History Narrative   Not on file   Social Determinants of Health   Financial Resource Strain: Not on file  Food Insecurity: Not on file  Transportation Needs: Not on file  Physical Activity: Not on file  Stress: Not on file  Social Connections: Not on file  Intimate Partner Violence: Not on file   Family Status  Relation Name Status   Mother  Alive       PVC   Father  Alive       pacemaker   PGF  (Not Specified)   Neg Hx  (Not Specified)   Family History  Problem Relation Age of Onset    Hypertension Father    Prostate cancer Father    Prostate cancer Paternal Grandfather    Colon cancer Neg Hx    No Known Allergies  Patient Care Team: Birdie Sons, MD as PCP - General (Family Medicine) Emmaline Kluver., MD (Rheumatology) Vladimir Crofts, MD as Consulting Physician (Neurology) Teodoro Spray, MD as Consulting Physician (Cardiology)   Medications: Outpatient Medications Prior to Visit  Medication Sig   aspirin-acetaminophen-caffeine (EXCEDRIN MIGRAINE) 250-250-65 MG tablet Take 1 tablet by mouth every 6 (six) hours as needed for headache.   busPIRone (BUSPAR) 15 MG tablet Take 0.5-1 tablets (7.5-15 mg total) by mouth 2 (two) times daily.   diltiazem (CARDIZEM CD) 240 MG 24 hr capsule Take 1 capsule by mouth once daily   lisinopril (ZESTRIL) 10 MG tablet Take 1 tablet (10 mg total) by mouth daily.   naproxen (NAPROSYN) 250 MG tablet Take 2 tablets (500 mg total) by mouth 2 (two) times daily with a meal.   potassium chloride SA (KLOR-CON) 20 MEQ tablet Take 20 mEq by mouth 2 (two) times daily.   No facility-administered medications prior to visit.    Review of Systems  Constitutional: Negative.   HENT: Negative.  Eyes: Negative.   Respiratory: Negative.    Cardiovascular: Negative.   Gastrointestinal: Negative.   Endocrine: Negative.   Genitourinary: Negative.   Musculoskeletal: Negative.   Skin: Negative.   Allergic/Immunologic: Negative.   Neurological: Negative.   Hematological: Negative.   Psychiatric/Behavioral: Negative.       Objective    BP (!) 132/100 (BP Location: Right Arm, Patient Position: Sitting, Cuff Size: Large)    Pulse 73    Temp 98.2 F (36.8 C) (Oral)    Ht 5\' 11"  (1.803 m)    Wt 175 lb (79.4 kg)    SpO2 100%    BMI 24.41 kg/m    Physical Exam    General Appearance:    Well developed, well nourished male. Alert, cooperative, in no acute distress, appears stated age  Head:    Normocephalic, without obvious abnormality,  atraumatic  Eyes:    PERRL, conjunctiva/corneas clear, EOM's intact, fundi    benign, both eyes       Ears:    Normal TM's and external ear canals, both ears  Nose:   Nares normal, septum midline, mucosa normal, no drainage   or sinus tenderness  Throat:   Lips, mucosa, and tongue normal; teeth and gums normal  Neck:   Supple, symmetrical, trachea midline, no adenopathy;       thyroid:  No enlargement/tenderness/nodules; no carotid   bruit or JVD  Back:     Symmetric, no curvature, ROM normal, no CVA tenderness  Lungs:     Clear to auscultation bilaterally, respirations unlabored  Chest wall:    No tenderness or deformity  Heart:    Normal heart rate. Regular rhythm. No murmurs, rubs, or gallops.  S1 and S2 normal  Abdomen:     Soft, non-tender, bowel sounds active all four quadrants,    no masses, no organomegaly  Genitalia:    deferred  Rectal:    deferred  Extremities:   All extremities are intact. No cyanosis or edema  Pulses:   2+ and symmetric all extremities  Skin:   Skin color, texture, turgor normal, no rashes or lesions  Lymph nodes:   Cervical, supraclavicular, and axillary nodes normal  Neurologic:   CNII-XII intact. Normal strength, sensation and reflexes      throughout     Last depression screening scores PHQ 2/9 Scores 12/31/2021 10/23/2021 02/08/2021  PHQ - 2 Score 0 0 0  PHQ- 9 Score 1 1 2   Exception Documentation - - -   Last fall risk screening Fall Risk  12/31/2021  Falls in the past year? 0  Number falls in past yr: 0  Injury with Fall? 0  Risk for fall due to : No Fall Risks  Follow up Falls evaluation completed   Last Audit-C alcohol use screening Alcohol Use Disorder Test (AUDIT) 12/31/2021  1. How often do you have a drink containing alcohol? 0  2. How many drinks containing alcohol do you have on a typical day when you are drinking? 0  3. How often do you have six or more drinks on one occasion? 0  AUDIT-C Score 0  Alcohol Brief Interventions/Follow-up  -   A score of 3 or more in women, and 4 or more in men indicates increased risk for alcohol abuse, EXCEPT if all of the points are from question 1   No results found for any visits on 12/31/21.  Assessment & Plan    Routine Health Maintenance and Physical Exam  Exercise Activities and  Dietary recommendations  Goals   None     Immunization History  Administered Date(s) Administered   Influenza,inj,Quad PF,6+ Mos 02/08/2019, 10/01/2019   Tdap 02/08/2019   Zoster Recombinat (Shingrix) 02/08/2019, 06/29/2019    Health Maintenance  Topic Date Due   COVID-19 Vaccine (1) Never done   Fecal DNA (Cologuard)  01/19/2021   INFLUENZA VACCINE  03/22/2022 (Originally 07/23/2021)   TETANUS/TDAP  02/08/2029   Hepatitis C Screening  Completed   HIV Screening  Completed   Zoster Vaccines- Shingrix  Completed   Pneumococcal Vaccine 42-38 Years old  Aged Out   HPV VACCINES  Aged Out    Discussed health benefits of physical activity, and encouraged him to engage in regular exercise appropriate for his age and condition.  1. Annual physical exam Is noted to have had elevated glucose at ER visit in December. Will return fasting to check  - Lipid panel - Glucose  2. Colon cancer screening  - Cologuard  3. Need for influenza vaccination  - Flu Vaccine QUAD 6+ mos PF IM (Fluarix Quad PF)  4. Primary hypertension Uncontrolled, will increase from 10mg  to  lisinopril (ZESTRIL) 20 MG tablet; Take 1 tablet (20 mg total) by mouth daily.  Dispense: 90 tablet; Refill: 3        The entirety of the information documented in the History of Present Illness, Review of Systems and Physical Exam were personally obtained by me. Portions of this information were initially documented by the CMA and reviewed by me for thoroughness and accuracy.     Lelon Huh, MD  [facility] [phone] (phone) [phone] (fax)  [place]

## 2022-01-08 ENCOUNTER — Telehealth: Payer: Self-pay | Admitting: Urology

## 2022-01-08 ENCOUNTER — Ambulatory Visit
Admission: RE | Admit: 2022-01-08 | Discharge: 2022-01-08 | Disposition: A | Discharge status: Home / Self Care | Payer: BC Managed Care – PPO | Source: Ambulatory Visit | Attending: Urology | Admitting: Urology

## 2022-01-08 DIAGNOSIS — R972 Elevated prostate specific antigen [PSA]: Secondary | ICD-10-CM | POA: Diagnosis not present

## 2022-01-08 DIAGNOSIS — Z0000 Encounter for general adult medical examination without abnormal findings: Secondary | ICD-10-CM | POA: Diagnosis not present

## 2022-01-08 DIAGNOSIS — R590 Localized enlarged lymph nodes: Secondary | ICD-10-CM | POA: Diagnosis not present

## 2022-01-08 DIAGNOSIS — R361 Hematospermia: Secondary | ICD-10-CM | POA: Insufficient documentation

## 2022-01-08 MED ORDER — GADOBUTROL 1 MMOL/ML IV SOLN
7.5000 mL | Freq: Once | INTRAVENOUS | Status: AC | PRN
  Administered 2022-01-08: 7.5 mL via INTRAVENOUS

## 2022-01-08 NOTE — Telephone Encounter (Signed)
Pt called in and would like someone to give him a call to go over his MRI results. He saw the results in MyChart and doesn't understand them, so he wants someone to call him and explain them to him.

## 2022-01-09 ENCOUNTER — Encounter: Payer: Self-pay | Admitting: Urology

## 2022-01-09 LAB — LIPID PANEL
Chol/HDL Ratio: 2.7 ratio (ref 0.0–5.0)
Cholesterol, Total: 142 mg/dL (ref 100–199)
HDL: 53 mg/dL (ref >39)
LDL Chol Calc (NIH): 78 mg/dL (ref 0–99)
Triglycerides: 49 mg/dL (ref 0–149)
VLDL Cholesterol Cal: 11 mg/dL (ref 5–40)

## 2022-01-09 LAB — GLUCOSE, RANDOM: Glucose: 100 mg/dL — ABNORMAL HIGH (ref 70–99)

## 2022-01-09 NOTE — Telephone Encounter (Signed)
Patient would like have a my chart message tonight.

## 2022-01-10 ENCOUNTER — Other Ambulatory Visit: Payer: Self-pay | Admitting: Urology

## 2022-01-10 DIAGNOSIS — R935 Abnormal findings on diagnostic imaging of other abdominal regions, including retroperitoneum: Secondary | ICD-10-CM

## 2022-01-10 DIAGNOSIS — R972 Elevated prostate specific antigen [PSA]: Secondary | ICD-10-CM

## 2022-01-14 DIAGNOSIS — Z1211 Encounter for screening for malignant neoplasm of colon: Secondary | ICD-10-CM | POA: Diagnosis not present

## 2022-01-16 DIAGNOSIS — I10 Essential (primary) hypertension: Secondary | ICD-10-CM | POA: Diagnosis not present

## 2022-01-16 DIAGNOSIS — Z8679 Personal history of other diseases of the circulatory system: Secondary | ICD-10-CM | POA: Diagnosis not present

## 2022-01-16 DIAGNOSIS — R002 Palpitations: Secondary | ICD-10-CM | POA: Diagnosis not present

## 2022-01-16 DIAGNOSIS — I480 Paroxysmal atrial fibrillation: Secondary | ICD-10-CM | POA: Diagnosis not present

## 2022-01-22 LAB — COLOGUARD: COLOGUARD: NEGATIVE

## 2022-02-01 DIAGNOSIS — R972 Elevated prostate specific antigen [PSA]: Secondary | ICD-10-CM | POA: Diagnosis not present

## 2022-02-05 ENCOUNTER — Ambulatory Visit: Payer: Self-pay | Admitting: Urology

## 2022-02-05 ENCOUNTER — Telehealth: Payer: Self-pay | Admitting: Urology

## 2022-02-05 NOTE — Telephone Encounter (Signed)
I contacted Mr. Lacher to discuss his recent prostate biopsy.  He had no postbiopsy complaints.  Prostate volume was 28.4 g.  A total of 16 cores were taken with 4 ROI biopsies and 12 standard template biopsies.  All cores showed benign prostate tissue.  Recommend 9-month follow-up with PSA prior.

## 2022-02-06 ENCOUNTER — Other Ambulatory Visit: Payer: Self-pay | Admitting: Urology

## 2022-02-28 ENCOUNTER — Other Ambulatory Visit: Payer: BC Managed Care – PPO

## 2022-03-01 ENCOUNTER — Ambulatory Visit: Payer: BC Managed Care – PPO | Admitting: Urology

## 2022-03-04 ENCOUNTER — Ambulatory Visit: Payer: Self-pay

## 2022-03-04 NOTE — Telephone Encounter (Signed)
We have not checked his b12 levels, but we did check CBC in December and B12 deficiencies usually causes abnormalities in CBC.  ?He can try OTC B12 1,000 mg once a day for a few months to see if helps nerve sx. If not then schedule o.v. to evaluate ?

## 2022-03-04 NOTE — Telephone Encounter (Signed)
Pt wants to know if he is B12 was checked lately and if it is ok for him to start taking b12 supplements / please advise ? ? ?Pt. Asking to have Iron and B 12 levels checked due to "nerve issues in my hands." Please advise. ? ?Answer Assessment - Initial Assessment Questions ?1. REASON FOR CALL or QUESTION: "What is your reason for calling today?" or "How can I best ?help you?" or "What question do you have that I can help answer?" ?    Pt. Asking to have his iron ans B12 levels checked. ?2. CALLER: Document the source of call. (e.g., laboratory, patient). ?    Patient ? ?Protocols used: PCP Call - No Triage-A-AH ? ?

## 2022-03-05 DIAGNOSIS — E538 Deficiency of other specified B group vitamins: Secondary | ICD-10-CM | POA: Diagnosis not present

## 2022-03-05 DIAGNOSIS — R202 Paresthesia of skin: Secondary | ICD-10-CM | POA: Diagnosis not present

## 2022-03-05 DIAGNOSIS — R200 Anesthesia of skin: Secondary | ICD-10-CM | POA: Diagnosis not present

## 2022-03-05 DIAGNOSIS — E559 Vitamin D deficiency, unspecified: Secondary | ICD-10-CM | POA: Diagnosis not present

## 2022-03-05 DIAGNOSIS — G5603 Carpal tunnel syndrome, bilateral upper limbs: Secondary | ICD-10-CM | POA: Diagnosis not present

## 2022-03-05 NOTE — Telephone Encounter (Signed)
FYI ?Patient advised as below. He reports neurologist did order lab for B12. However he will start OTC b12.  ?

## 2022-03-06 ENCOUNTER — Other Ambulatory Visit: Payer: Self-pay | Admitting: Student

## 2022-03-06 ENCOUNTER — Other Ambulatory Visit (HOSPITAL_COMMUNITY): Payer: Self-pay | Admitting: Student

## 2022-03-06 ENCOUNTER — Other Ambulatory Visit: Payer: Self-pay | Admitting: Family Medicine

## 2022-03-06 DIAGNOSIS — R200 Anesthesia of skin: Secondary | ICD-10-CM

## 2022-03-06 DIAGNOSIS — F418 Other specified anxiety disorders: Secondary | ICD-10-CM

## 2022-03-07 ENCOUNTER — Telehealth: Payer: Self-pay

## 2022-03-07 NOTE — Telephone Encounter (Signed)
Copied from CRM [id number]. Topic: General - Other ?>> Mar 07, 2022 10:56 AM Marylen Ponto wrote: ?Reason for CRM: Pt spouse reports they split some of the lisinopril (ZESTRIL) 20 MG tablet in half but then they read that splitting the medication makes it less effective. Pt spouse stated she just wanted Dr. Sherrie Mustache to be aware. ?

## 2022-03-14 ENCOUNTER — Ambulatory Visit: Payer: BC Managed Care – PPO

## 2022-03-25 DIAGNOSIS — G5603 Carpal tunnel syndrome, bilateral upper limbs: Secondary | ICD-10-CM | POA: Diagnosis not present

## 2022-03-27 ENCOUNTER — Ambulatory Visit (INDEPENDENT_AMBULATORY_CARE_PROVIDER_SITE_OTHER): Payer: BC Managed Care – PPO | Admitting: Family Medicine

## 2022-03-27 ENCOUNTER — Encounter: Payer: Self-pay | Admitting: Family Medicine

## 2022-03-27 VITALS — BP 140/90 | HR 77 | Temp 98.4°F | Resp 16 | Wt 170.9 lb

## 2022-03-27 DIAGNOSIS — I209 Angina pectoris, unspecified: Secondary | ICD-10-CM | POA: Diagnosis not present

## 2022-03-27 DIAGNOSIS — M792 Neuralgia and neuritis, unspecified: Secondary | ICD-10-CM | POA: Diagnosis not present

## 2022-03-27 DIAGNOSIS — M542 Cervicalgia: Secondary | ICD-10-CM | POA: Insufficient documentation

## 2022-03-27 MED ORDER — GABAPENTIN 250 MG/5ML PO SOLN: 100.0000 mg | Freq: Every day | ORAL | 3 refills | Status: DC

## 2022-03-27 MED ORDER — CYCLOBENZAPRINE HCL 5 MG PO TABS
5.0000 mg | ORAL_TABLET | Freq: Three times a day (TID) | ORAL | 1 refills | Status: DC | PRN
Start: 2022-03-27 — End: 2022-05-01

## 2022-03-27 MED ORDER — NITROGLYCERIN 0.4 MG SL SUBL
0.4000 mg | SUBLINGUAL_TABLET | SUBLINGUAL | 3 refills | Status: DC | PRN
Start: 2022-03-27 — End: 2023-11-26

## 2022-03-27 NOTE — Progress Notes (Signed)
? ?I,Sulibeya S Dimas,acting as a scribe for Jacky Kindle, FNP.,have documented all relevant documentation on the behalf of Jacky Kindle, FNP,as directed by  Jacky Kindle, FNP while in the presence of Jacky Kindle, FNP. ?  ? ? ?Established patient visit ? ? ?Patient: Peter Wyatt   DOB: 05-28-1966   56 y.o. Male  MRN: [id number] ?Visit Date: 03/27/2022 ? ?Today's healthcare provider: Jacky Kindle, FNP  ? ?Introduced to Publishing rights manager role and practice setting.  All questions answered.  Discussed provider/patient relationship and expectations. ? ? ?Chief Complaint  ?Patient presents with  ? Ear Pain  ? ?Subjective  ?  ?Otalgia  ?There is pain in both ears. This is a new problem. The current episode started 1 to 4 weeks ago. The problem occurs every few hours. The problem has been gradually worsening. There has been no fever. The pain is mild. Associated symptoms include headaches and neck pain. Pertinent negatives include no coughing, ear discharge, hearing loss, rhinorrhea or sore throat. He has tried NSAIDs for the symptoms. The treatment provided moderate relief. There is no history of a chronic ear infection.   ?Patient reports he was seen by neurology last month and mentioned to them that he was having ear pain and numbness around his chin and jaw. He reports not being sure if the ear pain is from ear infection or from the numbness from his chin and jaw.  ? ?Medications: ?Outpatient Medications Prior to Visit  ?Medication Sig  ? aspirin-acetaminophen-caffeine (EXCEDRIN MIGRAINE) 250-250-65 MG tablet Take 1 tablet by mouth every 6 (six) hours as needed for headache.  ? diltiazem (CARDIZEM CD) 240 MG 24 hr capsule Take 1 capsule by mouth once daily  ? lisinopril (ZESTRIL) 20 MG tablet Take 1 tablet (20 mg total) by mouth daily.  ? naproxen (NAPROSYN) 250 MG tablet Take 2 tablets (500 mg total) by mouth 2 (two) times daily with a meal.  ? potassium chloride SA (KLOR-CON) 20 MEQ tablet Take 20 mEq by  mouth 2 (two) times daily.  ? busPIRone (BUSPAR) 15 MG tablet TAKE 1/2 TO 1 (ONE-HALF TO ONE) TABLET BY MOUTH TWICE DAILY (Patient not taking: Reported on 03/27/2022)  ? ?No facility-administered medications prior to visit.  ? ? ?Review of Systems  ?Constitutional:  Negative for appetite change, chills and fever.  ?HENT:  Positive for ear pain. Negative for ear discharge, hearing loss, rhinorrhea and sore throat.   ?Respiratory:  Positive for shortness of breath. Negative for cough.   ?Cardiovascular:  Negative for chest pain and palpitations.  ?Musculoskeletal:  Positive for neck pain.  ?Neurological:  Positive for numbness and headaches.  ? ?  ?  Objective  ?  ?BP 140/90 (BP Location: Left Arm, Patient Position: Sitting, Cuff Size: Large)   Pulse 77   Temp 98.4 ?F (36.9 ?C) (Temporal)   Resp 16   Wt 170 lb 14.4 oz (77.5 kg)   BMI 23.84 kg/m?  ?BP Readings from Last 3 Encounters:  ?03/27/22 140/90  ?12/31/21 (!) 132/100  ?12/22/21 (!) 135/106  ? ?Wt Readings from Last 3 Encounters:  ?03/27/22 170 lb 14.4 oz (77.5 kg)  ?12/31/21 175 lb (79.4 kg)  ?12/22/21 168 lb (76.2 kg)  ? ?Problem List Items Addressed This Visit   ? ?  ? Cardiovascular and Mediastinum  ? Angina pectoris (HCC)  ?  Associated with MSK side effects from demanding job ?Hx of HTN and P. Afib and PACs ?NSR today, BP borderline  high ?Trial of nitro SL to assist if symptoms don't improve with use of gaba and flexeril ?Refer back to cards for stress test ?Reports home BP is lower 120/70-80s ?  ?  ? Relevant Medications  ? nitroGLYCERIN (NITROSTAT) 0.4 MG SL tablet  ?  ? Other  ? Neck pain  ?  Likely stress/ arthritis related ?Trial of flexeril provided ?Encouraged to try medication qHS to start ?Will refer to PT if not improved ?  ?  ? Relevant Medications  ? cyclobenzaprine (FLEXERIL) 5 MG tablet  ? Nerve pain - Primary  ?  Acute, facial, L side ?Recommend reschedule of MRI as previously discussed with neurology ?Recommend titration of gabapentin to  assist ?  ?  ? Relevant Medications  ? gabapentin (NEURONTIN) 250 MG/5ML solution  ? ? ? ?Return in about 1 month (around 04/26/2022), or if symptoms worsen or fail to improve.  ?   ? ?I, Jacky Kindle, FNP, have reviewed all documentation for this visit. The documentation on 03/27/22 for the exam, diagnosis, procedures, and orders are all accurate and complete. ? ? ? ?Jacky Kindle, FNP  ?[facility] ?[phone] (phone) ?[phone] (fax) ? ?[facility] ?

## 2022-03-27 NOTE — Assessment & Plan Note (Signed)
Likely stress/ arthritis related ?Trial of flexeril provided ?Encouraged to try medication qHS to start ?Will refer to PT if not improved ?

## 2022-03-27 NOTE — Assessment & Plan Note (Signed)
Acute, facial, L side ?Recommend reschedule of MRI as previously discussed with neurology ?Recommend titration of gabapentin to assist ?

## 2022-03-27 NOTE — Assessment & Plan Note (Signed)
Associated with MSK side effects from demanding job ?Hx of HTN and P. Afib and PACs ?NSR today, BP borderline high ?Trial of nitro SL to assist if symptoms don't improve with use of gaba and flexeril ?Refer back to cards for stress test ?Reports home BP is lower 120/70-80s ?

## 2022-03-29 ENCOUNTER — Ambulatory Visit: Payer: BC Managed Care – PPO | Admitting: Physician Assistant

## 2022-03-29 ENCOUNTER — Ambulatory Visit: Payer: Self-pay

## 2022-03-29 ENCOUNTER — Encounter: Payer: Self-pay | Admitting: Physician Assistant

## 2022-03-29 VITALS — BP 128/87 | HR 74 | Ht 71.0 in | Wt 168.5 lb

## 2022-03-29 DIAGNOSIS — R002 Palpitations: Secondary | ICD-10-CM | POA: Diagnosis not present

## 2022-03-29 DIAGNOSIS — I209 Angina pectoris, unspecified: Secondary | ICD-10-CM

## 2022-03-29 DIAGNOSIS — E876 Hypokalemia: Secondary | ICD-10-CM | POA: Insufficient documentation

## 2022-03-29 DIAGNOSIS — I10 Essential (primary) hypertension: Secondary | ICD-10-CM

## 2022-03-29 DIAGNOSIS — F418 Other specified anxiety disorders: Secondary | ICD-10-CM | POA: Diagnosis not present

## 2022-03-29 DIAGNOSIS — G5603 Carpal tunnel syndrome, bilateral upper limbs: Secondary | ICD-10-CM | POA: Insufficient documentation

## 2022-03-29 MED ORDER — GABAPENTIN 100 MG PO CAPS: 100.0000 mg | ORAL_CAPSULE | Freq: Every day | ORAL | 1 refills | Status: DC

## 2022-03-29 NOTE — Telephone Encounter (Signed)
?  Chief Complaint: Medication interaction concerns ?Symptoms: Pt feels a bit off ?Frequency: ongoing ?Pertinent Negatives: Patient denies SOB,  ?Disposition: [] ED /[] Urgent Care (no appt availability in office) / [x] Appointment(In office/virtual)/ []  [facility]/ [] Home Care/ [] Refused Recommended Disposition /[] [facility]/ []  Follow-up with PCP ?Additional Notes: Pt stopped taking the buspirone, as he read that it should not be taken with lisinopril. Pt has been reading up on his medications and now is concerned about interactions between all the medications he is taking. ? ? ? ? ?Summary: concerns about medications  ? Pt stated he recently stopped taking the busPIRone (BUSPAR) 15 MG tablet due to being told it should not be taken with lisinopril (ZESTRIL) 20 MG tablet. Pt  would like call back to discuss.   ?  ? ?Reason for Disposition ? [1] Caller has NON-URGENT medicine question about med that PCP prescribed AND [2] triager unable to answer question ? ?Answer Assessment - Initial Assessment Questions ?1. NAME of MEDICATION: "What medicine are you calling about?" ?    Buspirone and lisinopril together ?2. QUESTION: "What is your question?" (e.g., double dose of medicine, side effect) ?    Generally pt is concerned about interactions between all of his medications together. ?3. PRESCRIBING HCP: "Who prescribed it?" Reason: if prescribed by specialist, call should be referred to that group. ?    Cardiologist and PCP ?4. SYMPTOMS: "Do you have any symptoms?" ?    Feels off ?5. SEVERITY: If symptoms are present, ask "Are they mild, moderate or severe?" ?    mild ?6. PREGNANCY:  "Is there any chance that you are pregnant?" "When was your last menstrual period?" ?    na ? ?Protocols used: Medication Question Call-A-AH ? ?

## 2022-03-29 NOTE — Assessment & Plan Note (Signed)
Normal rhythm today. No current palpitations. ?Likely secondary to anxiety. ?Advised to mention to cardiologist ?Will check tsh/t4 do not see historically ?

## 2022-03-29 NOTE — Assessment & Plan Note (Deleted)
Normal rhythm today. No current palpitations. ?Likely secondary to anxiety. ?Advised to mention to cardiologist ?Will check tsh/t4 do not see historically ?

## 2022-03-29 NOTE — Assessment & Plan Note (Signed)
Vs anxiety ?Advised pt when to use nitro sl that was prescribed.  ?Advised pt to mention to cardiologist at next appt 4/18 ?

## 2022-03-29 NOTE — Assessment & Plan Note (Signed)
Advised pt to restart buspar. Can start at 1 pill daily and increase after one week to original BID dose.  ?Advised interaction w/ lisinopril is that it can cause hypotension--but he has never experienced this and always is in range at home. ?

## 2022-03-29 NOTE — Progress Notes (Signed)
? ?I,Evie Croston,acting as a scribe for [employer], PA-C.,have documented all relevant documentation on the behalf of [name], PA-C,as directed by  [name], PA-C while in the presence of [name], PA-C. ? ?Established Patient Office Visit ? ?Subjective:  ?Patient ID: Peter Wyatt, male    DOB: 1966-06-01  Age: 56 y.o. MRN: [id number] ? ?CC: concerned over med interaction ? ?Peter Wyatt is a 56 y/o male who presents today as he looked up buspar and lisinopril and saw they interact, so he stopped the buspar around 2-3 weeks ago. He has felt his anxiety has increased significantly since he stopped.  ? ?Reports years of intermittent heart palpitations, hx of afib controlled on diltiazem. He feels these palpitations intermittently still, h/o holter monitor with one run of SVT and some PACs/PVCs. Feels flushed when they occur.  ? ?He also reports one episode of chest pain w/ activity--mentioned at last visit 03/26/2022. He was sent in [facility] in case of pain, has not yet picked up . ? ? ?Past Medical History:  ?Diagnosis Date  ? Atrial fibrillation ([place])   ? Hypertension   ? ? ?History reviewed. No pertinent surgical history. ? ?Family History  ?Problem Relation Age of Onset  ? Hypertension Father   ? Prostate cancer Father   ? Prostate cancer Paternal Grandfather   ? Colon cancer Neg Hx   ? ? ?Social History  ? ?Socioeconomic History  ? Marital status: Married  ?  Spouse name: Not on file  ? Number of children: 2  ? Years of education: Not on file  ? Highest education level: Not on file  ?Occupational History  ? Occupation: Self Employed  ?Tobacco Use  ? Smoking status: Never  ? Smokeless tobacco: Never  ?Vaping Use  ? Vaping Use: Never used  ?Substance and Sexual Activity  ? Alcohol use: No  ? Drug use: No  ? Sexual activity: Not on file  ?Other Topics Concern  ? Not on file  ?Social History Narrative  ? Not on file  ? ?Social Determinants of Health  ? ?Financial Resource Strain: Not on file   ?Food Insecurity: Not on file  ?Transportation Needs: Not on file  ?Physical Activity: Not on file  ?Stress: Not on file  ?Social Connections: Not on file  ?Intimate Partner Violence: Not on file  ? ? ?Outpatient Medications Prior to Visit  ?Medication Sig Dispense Refill  ? aspirin-acetaminophen-caffeine (EXCEDRIN MIGRAINE) 250-250-65 MG tablet Take 1 tablet by mouth every 6 (six) hours as needed for headache.    ? cyclobenzaprine (FLEXERIL) 5 MG tablet Take 1 tablet (5 mg total) by mouth 3 (three) times daily as needed for muscle spasms. 30 tablet 1  ? diltiazem (CARDIZEM CD) 240 MG 24 hr capsule Take 1 capsule by mouth once daily 90 capsule 3  ? gabapentin (NEURONTIN) 250 MG/5ML solution Take 2 mLs (100 mg total) by mouth daily. Titrate to symptoms. 470 mL 3  ? lisinopril (ZESTRIL) 20 MG tablet Take 1 tablet (20 mg total) by mouth daily. 90 tablet 3  ? naproxen (NAPROSYN) 250 MG tablet Take 2 tablets (500 mg total) by mouth 2 (two) times daily with a meal. 60 tablet 0  ? nitroGLYCERIN (NITROSTAT) 0.4 MG SL tablet Place 1 tablet (0.4 mg total) under the tongue every 5 (five) minutes as needed for chest pain. Call 9-1-1 if you take the 3rd dose. 50 tablet 3  ? potassium chloride SA (KLOR-CON) 20 MEQ tablet Take 20 mEq by  mouth 2 (two) times daily.    ? busPIRone (BUSPAR) 15 MG tablet TAKE 1/2 TO 1 (ONE-HALF TO ONE) TABLET BY MOUTH TWICE DAILY (Patient not taking: Reported on 03/27/2022) 180 tablet 0  ? ?No facility-administered medications prior to visit.  ? ? ?No Known Allergies ? ?ROS ?Review of Systems  ?Constitutional:  Negative for fatigue and fever.  ?Respiratory:  Negative for cough and shortness of breath.   ?Cardiovascular:  Positive for chest pain and palpitations. Negative for leg swelling.  ?Neurological:  Negative for dizziness and headaches.  ?Psychiatric/Behavioral:  The patient is nervous/anxious.   ? ?  ?Objective:  ?  ?Physical Exam ?Constitutional:   ?   Appearance: Normal appearance. He is not  ill-appearing.  ?HENT:  ?   Head: Normocephalic.  ?Eyes:  ?   Conjunctiva/sclera: Conjunctivae normal.  ?Cardiovascular:  ?   Rate and Rhythm: Normal rate and regular rhythm.  ?   Pulses: Normal pulses.  ?Pulmonary:  ?   Effort: Pulmonary effort is normal.  ?   Breath sounds: Normal breath sounds.  ?Musculoskeletal:  ?   Right lower leg: No edema.  ?   Left lower leg: No edema.  ?Neurological:  ?   Mental Status: He is oriented to person, place, and time.  ?Psychiatric:     ?   Mood and Affect: Mood normal.     ?   Behavior: Behavior normal.  ? ? ?BP 128/87 Comment: home value  Pulse 74   Ht 5' 11"  (1.803 m)   Wt 168 lb 8 oz (76.4 kg)   SpO2 100%   BMI 23.50 kg/m?  ?Wt Readings from Last 3 Encounters:  ?03/29/22 168 lb 8 oz (76.4 kg)  ?03/27/22 170 lb 14.4 oz (77.5 kg)  ?12/31/21 175 lb (79.4 kg)  ? ? ? ?Health Maintenance Due  ?Topic Date Due  ? COVID-19 Vaccine (1) Never done  ? ? ?There are no preventive care reminders to display for this patient. ? ?No results found for: TSH ?Lab Results  ?Component Value Date  ? WBC 7.5 12/22/2021  ? HGB 16.0 12/22/2021  ? HCT 45.9 12/22/2021  ? MCV 85.6 12/22/2021  ? PLT 172 12/22/2021  ? ?Lab Results  ?Component Value Date  ? NA 138 12/22/2021  ? K 3.6 12/22/2021  ? CO2 27 12/22/2021  ? GLUCOSE 100 (H) 01/08/2022  ? BUN 16 12/22/2021  ? CREATININE 0.90 12/22/2021  ? BILITOT 0.9 09/01/2020  ? ALKPHOS 91 09/01/2020  ? AST 18 09/01/2020  ? ALT 13 09/01/2020  ? PROT 6.9 09/01/2020  ? ALBUMIN 4.7 09/01/2020  ? CALCIUM 9.1 12/22/2021  ? ANIONGAP 7 12/22/2021  ? EGFR 98 02/20/2021  ? ?Lab Results  ?Component Value Date  ? CHOL 142 01/08/2022  ? ?Lab Results  ?Component Value Date  ? HDL 53 01/08/2022  ? ?Lab Results  ?Component Value Date  ? [place] 78 01/08/2022  ? ?Lab Results  ?Component Value Date  ? TRIG 49 01/08/2022  ? ?Lab Results  ?Component Value Date  ? CHOLHDL 2.7 01/08/2022  ? ?No results found for: HGBA1C ? ?  ?Assessment & Plan:  ? ?Problem List Items Addressed  This Visit   ? ?  ? Cardiovascular and Mediastinum  ? Hypertension  ?  Elevated in office w/ normal values at home ?Likely element of white coat htn ?  ?  ? Angina pectoris ([place])  ?  Vs anxiety ?Advised pt when to use nitro sl that was  prescribed.  ?Advised pt to mention to cardiologist at next appt 4/18 ?  ?  ?  ? Nervous and Auditory  ? Bilateral carpal tunnel syndrome  ?  Experiences pain/neuropathy symptoms at night ?Has h/o injections ?Pt requests rx of gabapentin pill to see if this is cheaper than liquid. ?Advised 100 mg of gabapentin qhs, can titrate up as needed. ?Advised he wear the braces more consistently at night ?  ?  ? Relevant Medications  ? gabapentin (NEURONTIN) 100 MG capsule  ?  ? Other  ? Situational anxiety - Primary  ?  Advised pt to restart buspar. Can start at 1 pill daily and increase after one week to original BID dose.  ?Advised interaction w/ lisinopril is that it can cause hypotension--but he has never experienced this and always is in range at home. ?  ?  ? Palpitations  ?  Normal rhythm today. No current palpitations. ?Likely secondary to anxiety. ?Advised to mention to cardiologist ?Will check tsh/t4 do not see historically ?  ?  ? Relevant Orders  ? TSH + free T4  ? Hypokalemia  ?  Historically, takes potassium supplement. ?Pt unsure if he still needs- will recheck BMP today but likely will need to supplement as last K with supplementation 3.6 ?  ?  ? Relevant Orders  ? Basic Metabolic Panel (BMET)  ?  ? ?Follow-up: Return if symptoms worsen or fail to improve, at next scheduled appt.  ? ? ?I, [name], PA-C have reviewed all documentation for this visit. The documentation on  03/29/2022  for the exam, diagnosis, procedures, and orders are all accurate and complete. ? ?[name], PA-C ?[facility] [id number] ?[place], [place], [address] ?Office: [phone] ?Fax: [phone]  ?

## 2022-03-29 NOTE — Assessment & Plan Note (Signed)
Experiences pain/neuropathy symptoms at night ?Has h/o injections ?Pt requests rx of gabapentin pill to see if this is cheaper than liquid. ?Advised 100 mg of gabapentin qhs, can titrate up as needed. ?Advised he wear the braces more consistently at night ?

## 2022-03-29 NOTE — Assessment & Plan Note (Signed)
Historically, takes potassium supplement. ?Pt unsure if he still needs- will recheck BMP today but likely will need to supplement as last K with supplementation 3.6 ?

## 2022-03-29 NOTE — Assessment & Plan Note (Signed)
Elevated in office w/ normal values at home ?Likely element of white coat htn ?

## 2022-03-30 LAB — TSH+FREE T4
Free T4: 1.6 ng/dL (ref 0.82–1.77)
TSH: 2.2 u[IU]/mL (ref 0.450–4.500)

## 2022-03-30 LAB — BASIC METABOLIC PANEL
BUN/Creatinine Ratio: 16 (ref 9–20)
BUN: 15 mg/dL (ref 6–24)
CO2: 24 mmol/L (ref 20–29)
Calcium: 9.6 mg/dL (ref 8.7–10.2)
Chloride: 105 mmol/L (ref 96–106)
Creatinine, Ser: 0.93 mg/dL (ref 0.76–1.27)
Glucose: 103 mg/dL — ABNORMAL HIGH (ref 70–99)
Potassium: 4.3 mmol/L (ref 3.5–5.2)
Sodium: 143 mmol/L (ref 134–144)
eGFR: 98 mL/min/{1.73_m2} (ref >59)

## 2022-04-09 DIAGNOSIS — Z8679 Personal history of other diseases of the circulatory system: Secondary | ICD-10-CM | POA: Diagnosis not present

## 2022-04-09 DIAGNOSIS — R002 Palpitations: Secondary | ICD-10-CM | POA: Diagnosis not present

## 2022-04-09 DIAGNOSIS — I209 Angina pectoris, unspecified: Secondary | ICD-10-CM | POA: Diagnosis not present

## 2022-04-09 DIAGNOSIS — I10 Essential (primary) hypertension: Secondary | ICD-10-CM | POA: Diagnosis not present

## 2022-04-24 ENCOUNTER — Encounter: Payer: Self-pay | Admitting: Family Medicine

## 2022-04-24 ENCOUNTER — Emergency Department
Admission: EM | Admit: 2022-04-24 | Discharge: 2022-04-24 | Disposition: A | Discharge status: Home / Self Care | Payer: BC Managed Care – PPO | Attending: Emergency Medicine | Admitting: Emergency Medicine

## 2022-04-24 ENCOUNTER — Other Ambulatory Visit: Payer: Self-pay

## 2022-04-24 ENCOUNTER — Emergency Department: Payer: BC Managed Care – PPO

## 2022-04-24 DIAGNOSIS — R002 Palpitations: Secondary | ICD-10-CM | POA: Insufficient documentation

## 2022-04-24 DIAGNOSIS — I10 Essential (primary) hypertension: Secondary | ICD-10-CM | POA: Diagnosis not present

## 2022-04-24 DIAGNOSIS — Z79899 Other long term (current) drug therapy: Secondary | ICD-10-CM | POA: Insufficient documentation

## 2022-04-24 DIAGNOSIS — F419 Anxiety disorder, unspecified: Secondary | ICD-10-CM

## 2022-04-24 LAB — CBC
HCT: 47.8 % (ref 39.0–52.0)
Hemoglobin: 16.6 g/dL (ref 13.0–17.0)
MCH: 29.2 pg (ref 26.0–34.0)
MCHC: 34.7 g/dL (ref 30.0–36.0)
MCV: 84.2 fL (ref 80.0–100.0)
Platelets: 181 10*3/uL (ref 150–400)
RBC: 5.68 MIL/uL (ref 4.22–5.81)
RDW: 12.1 % (ref 11.5–15.5)
WBC: 7.1 10*3/uL (ref 4.0–10.5)
nRBC: 0 % (ref 0.0–0.2)

## 2022-04-24 LAB — BASIC METABOLIC PANEL
Anion gap: 9 (ref 5–15)
BUN: 18 mg/dL (ref 6–20)
CO2: 25 mmol/L (ref 22–32)
Calcium: 9.3 mg/dL (ref 8.9–10.3)
Chloride: 103 mmol/L (ref 98–111)
Creatinine, Ser: 0.97 mg/dL (ref 0.61–1.24)
GFR, Estimated: >60 mL/min (ref >60)
Glucose, Bld: 196 mg/dL — ABNORMAL HIGH (ref 70–99)
Potassium: 3.5 mmol/L (ref 3.5–5.1)
Sodium: 137 mmol/L (ref 135–145)

## 2022-04-24 LAB — TROPONIN I (HIGH SENSITIVITY)
Troponin I (High Sensitivity): 5 ng/L (ref <18)
Troponin I (High Sensitivity): 8 ng/L (ref <18)

## 2022-04-24 NOTE — ED Provider Notes (Signed)
? ?[facility] ?Provider Note ? ? ? Event Date/Time  ? First MD Initiated Contact with Patient 04/24/22 1351   ?  (approximate) ? ? ?History  ? ?Palpitations ? ? ?HPI ? ?Peter Wyatt is a 56 y.o. male with a history of hypertension, paroxysmal atrial fibrillation, he reports he is on diltiazem and takes it as prescribed.  He also takes lisinopril for blood pressure.  He reports over the last several days he feels as if his atrial fibrillation has been acting up.  He has had intermittent palpitations that have lasted for less than a couple of minutes.  Today he woke up concerned because his blood pressure was higher than typical.  When he bent over he felt his heart racing, it resolved within minutes.  Currently feels well and has no complaints ?  ? ? ?Physical Exam  ? ?Triage Vital Signs: ?ED Triage Vitals  ?Enc Vitals Group  ?   BP 04/24/22 1219 (!) 140/98  ?   Pulse Rate 04/24/22 1219 80  ?   Resp 04/24/22 1219 20  ?   Temp 04/24/22 1219 98.7 ?F (37.1 ?C)  ?   Temp Source 04/24/22 1219 Oral  ?   SpO2 04/24/22 1219 99 %  ?   Weight 04/24/22 1214 74.8 kg (165 lb)  ?   Height 04/24/22 1214 1.829 m (6')  ?   Head Circumference --   ?   Peak Flow --   ?   Pain Score 04/24/22 1214 0  ?   Pain Loc --   ?   Pain Edu? --   ?   Excl. in GC? --   ? ? ?Most recent vital signs: ?Vitals:  ? 04/24/22 1401 04/24/22 1545  ?BP:  (!) 141/72  ?Pulse: 74 85  ?Resp: 18 18  ?Temp:  98.5 ?F (36.9 ?C)  ?SpO2: 100% 98%  ? ? ? ?General: Awake, no distress.  ?CV:  Good peripheral perfusion.  Regular rate and rhythm, warm and well-perfused ?Resp:  Normal effort.  ?Abd:  No distention.  ?Other:   ? ? ?ED Results / Procedures / Treatments  ? ?Labs ?(all labs ordered are listed, but only abnormal results are displayed) ?Labs Reviewed  ?BASIC METABOLIC PANEL - Abnormal; Notable for the following components:  ?    Result Value  ? Glucose, Bld 196 (*)   ? All other components within normal limits  ?CBC  ?TROPONIN I (HIGH  SENSITIVITY)  ?TROPONIN I (HIGH SENSITIVITY)  ? ? ? ?EKG ? ?ED ECG REPORT ?I, Jene Every, the attending physician, personally viewed and interpreted this ECG. ? ?Date: 04/24/2022 ? ?Rhythm: normal sinus rhythm ?QRS Axis: normal ?Intervals: Nonspecific changes ?ST/T Wave abnormalities: normal ?Narrative Interpretation: no evidence of acute ischemia ? ? ? ?RADIOLOGY ?Chest x-ray viewed by me, no acute abnormality ? ? ? ?PROCEDURES: ? ?Critical Care performed:  ? ?Procedures ? ? ?MEDICATIONS ORDERED IN ED: ?Medications - No data to display ? ? ?IMPRESSION / MDM / ASSESSMENT AND PLAN / ED COURSE  ?I reviewed the triage vital signs and the nursing notes. ? ?Patient well-appearing and asymptomatic here in the emergency department.  He is well-appearing and in no acute distress ? ?He denies chest pain.  His lab work today is quite reassuring, delta troponin is negative x2.  EKG not consistent with atrial fibrillation.  He is compliant with his medications. ? ?He has already messaged his cardiologist Dr. Lady Gary and will obtain Holter monitor for  further evaluation. ? ?Considered admission for observation however the patient is asymptomatic, has excellent outpatient follow-up, appropriate for discharge at this time ? ? ? ? ? ?  ? ? ?FINAL CLINICAL IMPRESSION(S) / ED DIAGNOSES  ? ?Final diagnoses:  ?Palpitations  ? ? ? ?Rx / DC Orders  ? ?ED Discharge Orders   ? ? None  ? ?  ? ? ? ?Note:  This document was prepared using Dragon voice recognition software and may include unintentional dictation errors. ?  ?Jene Every, MD ?04/24/22 1546 ? ?

## 2022-04-24 NOTE — Telephone Encounter (Signed)
Please review. Looks like patient is in the ER now.  ?

## 2022-04-24 NOTE — ED Triage Notes (Signed)
First Nurse Note:  C/O palpitations intermittently x 1 week. ?

## 2022-04-24 NOTE — ED Triage Notes (Signed)
Pt via POV from home. Pt c/o palpitations for the past weeks that go worse states it was "skipping" a lot. Pt is on diltiazem and has been taking as prescribed. Pt has a hx of a fib. Denies CP. Denies SOB. Pt is A&OX4 and NAD  ?

## 2022-04-25 DIAGNOSIS — I480 Paroxysmal atrial fibrillation: Secondary | ICD-10-CM | POA: Diagnosis not present

## 2022-04-26 MED ORDER — VENLAFAXINE HCL ER 37.5 MG PO CP24: 37.5000 mg | ORAL_CAPSULE | Freq: Every day | ORAL | 0 refills | Status: DC

## 2022-04-29 ENCOUNTER — Other Ambulatory Visit: Payer: Self-pay | Admitting: Family Medicine

## 2022-04-29 DIAGNOSIS — F419 Anxiety disorder, unspecified: Secondary | ICD-10-CM

## 2022-04-29 NOTE — Telephone Encounter (Signed)
Copied from CRM [id number]. Topic: General - Other ?>> Apr 29, 2022  3:33 PM Gaetana Michaelis A wrote: ?Reason for CRM: Medication Refill - Medication: venlafaxine XR (EFFEXOR XR) 37.5 MG 24 hr CAPSULE  [[id number]]  ? ?Has the patient contacted their pharmacy? Yes.  The patient was directed to contact their PCP ?(Agent: If no, request that the patient contact the pharmacy for the refill. If patient does not wish to contact the pharmacy document the reason why and proceed with request.) ?(Agent: If yes, when and what did the pharmacy advise?) ? ?Preferred Pharmacy (with phone number or street name): [id number] - [place], [place] - [address] ?[address] [place] [address] ?Phone: [phone] Fax: [phone] ?Hours: Not open 24 hours ? ?Has the patient been seen for an appointment in the last year OR does the patient have an upcoming appointment? Yes.   ? ?Agent: Please be advised that RX refills may take up to 3 business days. We ask that you follow-up with your pharmacy. ?

## 2022-04-30 NOTE — Telephone Encounter (Signed)
Rx 04/26/22 #30- too soon ?Requested Prescriptions  ?Pending Prescriptions Disp Refills  ?? venlafaxine XR (EFFEXOR XR) 37.5 MG 24 hr capsule 30 capsule 0  ?  Sig: Take 1 capsule (37.5 mg total) by mouth daily with breakfast.  ?  ? Psychiatry: Antidepressants - SNRI - desvenlafaxine & venlafaxine Failed - 04/29/2022  4:46 PM  ?  ?  Failed - Last BP in normal range  ?  BP Readings from Last 1 Encounters:  ?04/24/22 (!) 141/72  ?   ?  ?  Failed - Lipid Panel in normal range within the last 12 months  ?  Cholesterol, Total  ?Date Value Ref Range Status  ?01/08/2022 142 100 - 199 mg/dL Final  ? ?LDL Chol Calc (NIH)  ?Date Value Ref Range Status  ?01/08/2022 78 0 - 99 mg/dL Final  ? ?HDL  ?Date Value Ref Range Status  ?01/08/2022 53 >39 mg/dL Final  ? ?Triglycerides  ?Date Value Ref Range Status  ?01/08/2022 49 0 - 149 mg/dL Final  ? ?  ?  ?  Passed - Cr in normal range and within 360 days  ?  Creatinine, Ser  ?Date Value Ref Range Status  ?04/24/2022 0.97 0.61 - 1.24 mg/dL Final  ?   ?  ?  Passed - Valid encounter within last 6 months  ?  Recent Outpatient Visits   ?      ? 1 month ago Situational anxiety  ? [place], PA-C  ? 1 month ago Nerve pain  ? [facility] Tally Joe T, FNP  ? 4 months ago Annual physical exam  ? [facility] Birdie Sons, MD  ? 6 months ago Primary hypertension  ? [facility] Caryn Section, Kirstie Peri, MD  ? 1 year ago Primary hypertension  ? [facility], Dionne Bucy, MD  ?  ?  ?Future Appointments   ?        ? Tomorrow Birdie Sons, MD [facility]  ? In 3 months Stoioff, Ronda Fairly, MD [place]  ?  ? ?  ?  ?  ? ?

## 2022-05-01 ENCOUNTER — Ambulatory Visit: Payer: BC Managed Care – PPO | Admitting: Family Medicine

## 2022-05-01 ENCOUNTER — Encounter: Payer: Self-pay | Admitting: Family Medicine

## 2022-05-01 VITALS — BP 118/86 | HR 80 | Temp 98.4°F | Resp 16 | Wt 169.6 lb

## 2022-05-01 DIAGNOSIS — R519 Headache, unspecified: Secondary | ICD-10-CM

## 2022-05-01 DIAGNOSIS — Z8679 Personal history of other diseases of the circulatory system: Secondary | ICD-10-CM | POA: Diagnosis not present

## 2022-05-01 DIAGNOSIS — M792 Neuralgia and neuritis, unspecified: Secondary | ICD-10-CM

## 2022-05-01 DIAGNOSIS — R002 Palpitations: Secondary | ICD-10-CM | POA: Diagnosis not present

## 2022-05-01 DIAGNOSIS — F419 Anxiety disorder, unspecified: Secondary | ICD-10-CM

## 2022-05-01 MED ORDER — VENLAFAXINE HCL ER 37.5 MG PO CP24: 37.5000 mg | ORAL_CAPSULE | Freq: Every day | ORAL | 0 refills | Status: DC

## 2022-05-01 NOTE — Progress Notes (Signed)
?  ? ? ?I,Jana Robinson,acting as a scribe for [name], MD.,have documented all relevant documentation on the behalf of [name], MD,as directed by  [name], MD while in the presence of [name], MD. ? ?Established patient visit ? ? ?Patient: Peter Wyatt   DOB: 1966/05/24   56 y.o. Male  MRN: [id number] ?Visit Date: 05/01/2022 ? ?Today's healthcare provider: [name], MD  ? ?Chief Complaint  ?Patient presents with  ? Hypertension  ? Anxiety  ? ?Subjective  ?  ?Hypertension, follow-up ? ?BP Readings from Last 3 Encounters:  ?05/01/22 118/86  ?04/24/22 (!) 141/72  ?03/29/22 128/87  ? Wt Readings from Last 3 Encounters:  ?05/01/22 169 lb 9.6 oz (76.9 kg)  ?04/24/22 165 lb (74.8 kg)  ?03/29/22 168 lb 8 oz (76.4 kg)  ?  ? ?He was last seen for hypertension 4 months ago.  ?BP at that visit was 132 100. Management since that visit includes increase from 10mg  to lisinopril 20 MG tablet;. ?He reports excellent compliance with treatment. ?He is not having side effects.  ?He is following a Regular diet. ?He is not exercising. ?He does not smoke. ? ?Use of agents associated with hypertension: none.  ? ?Outside blood pressures are average 138/85. Up and down for past several weeks.  ?Symptoms: ?No chest pain No chest pressure  ?No palpitations No syncope  ?No dyspnea No orthopnea  ?No paroxysmal nocturnal dyspnea No lower extremity edema  ? ?Pertinent labs ?Lab Results  ?Component Value Date  ? CHOL 142 01/08/2022  ? HDL 53 01/08/2022  ? LDLCALC 78 01/08/2022  ? TRIG 49 01/08/2022  ? CHOLHDL 2.7 01/08/2022  ? Lab Results  ?Component Value Date  ? NA 137 04/24/2022  ? K 3.5 04/24/2022  ? CREATININE 0.97 04/24/2022  ? GFRNONAA >60 04/24/2022  ? GLUCOSE 196 (H) 04/24/2022  ? TSH 2.200 03/29/2022  ?  ? ?The 10-year ASCVD risk score (Arnett DK, et al., 2019) is: 3.3% ? ?---------------------------------------------------------------------------------------------------  ? ?Anxiety, Follow-up ? ?He was  last seen for anxiety 1 mo ago: starting 04-24-22 venlafaxine XR 37.5 mg daily. ? ?Over the last few months he has several episodes of heart racing one of which precipitated a visit to ER where he had normal cardiac work up. He has felt persistent pressure across his head. He thought maybe symptoms were related to interactions of his medications so he stopped taking buspirone gabapentin and cyclobenzaprine a few days ago and states he feels much better.   He has been on buspirone since 2020 which seems to have been effective for anxiety, but he does not want to take it anymore due to potential interaction with diltiazem and lisinopril. Venlafaxine was sent to [facility] last week, but he states they have not yet been able to dispense the ER capsules that were prescribed.  ? ? ?GAD-7 Results ? ?  03/29/2022  ?  3:51 PM 10/23/2021  ?  8:17 AM  ?GAD-7 Generalized Anxiety Disorder Screening Tool  ?1. Feeling Nervous, Anxious, or on Edge 1 0  ?2. Not Being Able to Stop or Control Worrying 1 1  ?3. Worrying Too Much About Different Things 2 1  ?4. Trouble Relaxing 1 0  ?5. Being So Restless it's Hard To Sit Still 0 1  ?6. Becoming Easily Annoyed or Irritable 0 1  ?7. Feeling Afraid As If Something Awful Might Happen 0 0  ?Total GAD-7 Score 5 4  ?Difficulty At Work, Home, or Getting  Along With Others? Not difficult at all Not difficult at all  ? ? ?  05/01/2022  ?  8:16 AM 03/29/2022  ?  3:51 PM 10/23/2021  ?  8:17 AM  ?GAD 7 : Generalized Anxiety Score  ?Nervous, Anxious, on Edge 2 1 0  ?Control/stop worrying 1 1 1   ?Worry too much - different things 1 2 1   ?Trouble relaxing 1 1 0  ?Restless 0 0 1  ?Easily annoyed or irritable 0 0 1  ?Afraid - awful might happen 1 0 0  ?Total GAD 7 Score 6 5 4   ?Anxiety Difficulty Not difficult at all Not difficult at all Not difficult at all  ? ? ? ?PHQ-9 Scores ? ?  03/27/2022  ?  4:03 PM 12/31/2021  ? 10:00 AM 10/23/2021  ?  8:16 AM  ?PHQ9 SCORE ONLY  ?PHQ-9 Total Score 2 1 1    ? ? ?---------------------------------------------------------------------------------------------------  ?Medications: ?Outpatient Medications Prior to Visit  ?Medication Sig Note  ? aspirin-acetaminophen-caffeine (EXCEDRIN MIGRAINE) 250-250-65 MG tablet Take 1 tablet by mouth every 6 (six) hours as needed for headache.   ? diltiazem (CARDIZEM CD) 240 MG 24 hr capsule Take 1 capsule by mouth once daily   ? lisinopril (ZESTRIL) 20 MG tablet Take 1 tablet (20 mg total) by mouth daily.   ? naproxen (NAPROSYN) 250 MG tablet Take 2 tablets (500 mg total) by mouth 2 (two) times daily with a meal.   ? nitroGLYCERIN (NITROSTAT) 0.4 MG SL tablet Place 1 tablet (0.4 mg total) under the tongue every 5 (five) minutes as needed for chest pain. Call 9-1-1 if you take the 3rd dose.   ? potassium chloride SA (KLOR-CON) 20 MEQ tablet Take 20 mEq by mouth 2 (two) times daily.   ? vitamin B-12 (CYANOCOBALAMIN) 1000 MCG tablet Take 1,000 mcg by mouth daily.   ?  venlafaxine XR (EFFEXOR XR) 37.5 MG 24 hr capsule Take 1 capsule (37.5 mg total) by mouth daily with breakfast. (NOT STARTED TAKING YET)   ? ?No facility-administered medications prior to visit.  ? ? ?Review of Systems ? ? ?  Objective  ?  ?BP 118/86 (BP Location: Right Arm, Patient Position: Sitting, Cuff Size: Normal)   Pulse 80   Temp 98.4 ?F (36.9 ?C) (Oral)   Resp 16   Wt 169 lb 9.6 oz (76.9 kg)   SpO2 100%   BMI 23.00 kg/m?  ? ? ?Physical Exam  ?General appearance: Well developed, well nourished male, cooperative and in no acute distress ?Head: Normocephalic, without obvious abnormality, atraumatic ?Respiratory: Respirations even and unlabored, normal respiratory rate ?Extremities: All extremities are intact.  ?Skin: Skin color, texture, turgor normal. No rashes seen  ?Psych: Appropriate mood and affect. ?Neurologic: Mental status: Alert, oriented to person, place, and time, thought content appropriate.  ? Assessment & Plan  ?  ? ?1. Anxiety ?Now off buspirone due  to his concerns of potential drug interaction, He he had been taking since 2020. Will try low dose venlafaxine XR (EFFEXOR XR) 37.5 MG 24 hr capsule; Take 1 capsule (37.5 mg total) by mouth daily with breakfast.  Dispense: 30 capsule; Refill: 0 ? ?2. Palpitations ?Resolved since stopping buspirone, cyclobenzaprine, and gabapentin. May need early cardiology follow up if sx return.  ? ?3. History of atrial fibrillation ? ? ?4. Nonintractable headache, unspecified chronicity pattern, unspecified headache type ?Resolved since stopping buspirone, cyclobenzaprine, and gabapentin. ? ?5. Nerve pain ?Now resolved. He states neurologist wanted to schedule MRI, but he  wants to hold of on that since sx resolved. He is to let me know if symptoms return.  ?Other orders ?.  ?   ? ?The entirety of the information documented in the History of Present Illness, Review of Systems and Physical Exam were personally obtained by me. Portions of this information were initially documented by the CMA and reviewed by me for thoroughness and accuracy.   ? ? ?[name], MD  ?[facility] ?[phone] (phone) ?[phone] (fax) ? ?[facility] ?

## 2022-05-07 NOTE — Progress Notes (Signed)
?  ?I,Sha'taria Tyson,acting as a Neurosurgeon for [employer], PA-C.,have documented all relevant documentation on the behalf of Alfredia Ferguson, PA-C,as directed by  Alfredia Ferguson, PA-C while in the presence of Alfredia Ferguson, PA-C. ? ?Established patient visit ? ? ?Patient: Peter Wyatt   DOB: Jan 11, 1966   56 y.o. Male  MRN: [id number] ?Visit Date: 05/08/2022 ? ?Today's healthcare provider: Alfredia Ferguson, PA-C  ? ?Cc. Chest pain/rib pain  ? ?Subjective  ?  ?HPI  ?Nivaan is a 56 y/o male w/ a history of paroxysmal afib, follows with cardiology, who presents with occasional L lower rib/chest pain that comes and goes. Happens more with activity. Radiates to upper chest which causes him to feel flushed and numb in the face. Admits to significant anxiety and is unsure If this is exacerbating the problem. Reports some associated SOB during these episodes, but denies diaphoresis, changes in vision, syncope.  ? ?Medications: ?Outpatient Medications Prior to Visit  ?Medication Sig  ? aspirin-acetaminophen-caffeine (EXCEDRIN MIGRAINE) 250-250-65 MG tablet Take 1 tablet by mouth every 6 (six) hours as needed for headache.  ? diltiazem (CARDIZEM CD) 240 MG 24 hr capsule Take 1 capsule by mouth once daily  ? lisinopril (ZESTRIL) 20 MG tablet Take 1 tablet (20 mg total) by mouth daily.  ? naproxen (NAPROSYN) 250 MG tablet Take 2 tablets (500 mg total) by mouth 2 (two) times daily with a meal.  ? nitroGLYCERIN (NITROSTAT) 0.4 MG SL tablet Place 1 tablet (0.4 mg total) under the tongue every 5 (five) minutes as needed for chest pain. Call 9-1-1 if you take the 3rd dose.  ? potassium chloride SA (KLOR-CON) 20 MEQ tablet Take 20 mEq by mouth 2 (two) times daily.  ? venlafaxine XR (EFFEXOR XR) 37.5 MG 24 hr capsule Take 1 capsule (37.5 mg total) by mouth daily with breakfast.  ? vitamin B-12 (CYANOCOBALAMIN) 1000 MCG tablet Take 1,000 mcg by mouth daily.  ? ?No facility-administered medications prior to visit.  ? ? ?Review of  Systems  ?Constitutional:  Negative for fatigue and fever.  ?Respiratory:  Negative for cough and shortness of breath.   ?Cardiovascular:  Positive for chest pain and palpitations. Negative for leg swelling.  ?Neurological:  Positive for numbness. Negative for dizziness and headaches.  ? ? ?  Objective  ?  ?Blood pressure 122/75, pulse 78, height 5\' 11"  (1.803 m), weight 168 lb 11.2 oz (76.5 kg), SpO2 100 %.  ? ?Physical Exam ?Constitutional:   ?   General: He is awake.  ?   Appearance: He is well-developed.  ?HENT:  ?   Head: Normocephalic.  ?Eyes:  ?   Conjunctiva/sclera: Conjunctivae normal.  ?Cardiovascular:  ?   Rate and Rhythm: Normal rate and regular rhythm.  ?   Heart sounds: Normal heart sounds.  ?Pulmonary:  ?   Effort: Pulmonary effort is normal.  ?   Breath sounds: Normal breath sounds.  ?Abdominal:  ?   Palpations: Abdomen is soft.  ?   Tenderness: There is no abdominal tenderness.  ?Musculoskeletal:     ?   General: No tenderness.  ?   Comments: Low back non tender to palpation. L lateral ribs nontender  ?Skin: ?   General: Skin is warm.  ?Neurological:  ?   Mental Status: He is alert and oriented to person, place, and time.  ?Psychiatric:     ?   Attention and Perception: Attention normal.     ?   Mood and Affect: Mood normal.     ?  Speech: Speech normal.     ?   Behavior: Behavior is cooperative.  ?  ? ?No results found for any visits on 05/08/22. ? Assessment & Plan  ?  ? ?Problem List Items Addressed This Visit   ? ?  ? Cardiovascular and Mediastinum  ? Angina pectoris (HCC) - Primary  ?  Managed by cardio. Just completed a holter monitor. Has a stress test scheduled.  ?EKG today stable from previous, normal sinus. ?May be attributed to anxiety/musculoskeletal pain? Advised stretching, warm compress. ?Gave ED precautions ? ? ? ?  ?  ? ?Other Visit Diagnoses   ? ? Chest pain, unspecified type      ? Relevant Orders  ? EKG 12-Lead (Completed)  ? ?  ?  ? ?I, Alfredia Ferguson, PA-C have reviewed all  documentation for this visit. The documentation on  05/08/2022  for the exam, diagnosis, procedures, and orders are all accurate and complete. ? ?Alfredia Ferguson, PA-C ?[facility] ?[address] ?[place], [place], [address] ?Office: [phone] ?Fax: [phone]  ? ?[facility] ? ?

## 2022-05-08 ENCOUNTER — Encounter: Payer: Self-pay | Admitting: Physician Assistant

## 2022-05-08 ENCOUNTER — Ambulatory Visit: Payer: BC Managed Care – PPO | Admitting: Physician Assistant

## 2022-05-08 VITALS — BP 122/75 | HR 78 | Ht 71.0 in | Wt 168.7 lb

## 2022-05-08 DIAGNOSIS — I209 Angina pectoris, unspecified: Secondary | ICD-10-CM

## 2022-05-08 DIAGNOSIS — R079 Chest pain, unspecified: Secondary | ICD-10-CM | POA: Diagnosis not present

## 2022-05-08 IMAGING — CR DG CHEST 2V
3 series · 3 of 3 positions shown · non-contrast
Comparison: 04/27/2020

CLINICAL DATA: Thermal injury.

EXAM:
CHEST - 2 VIEW

[chest pa]
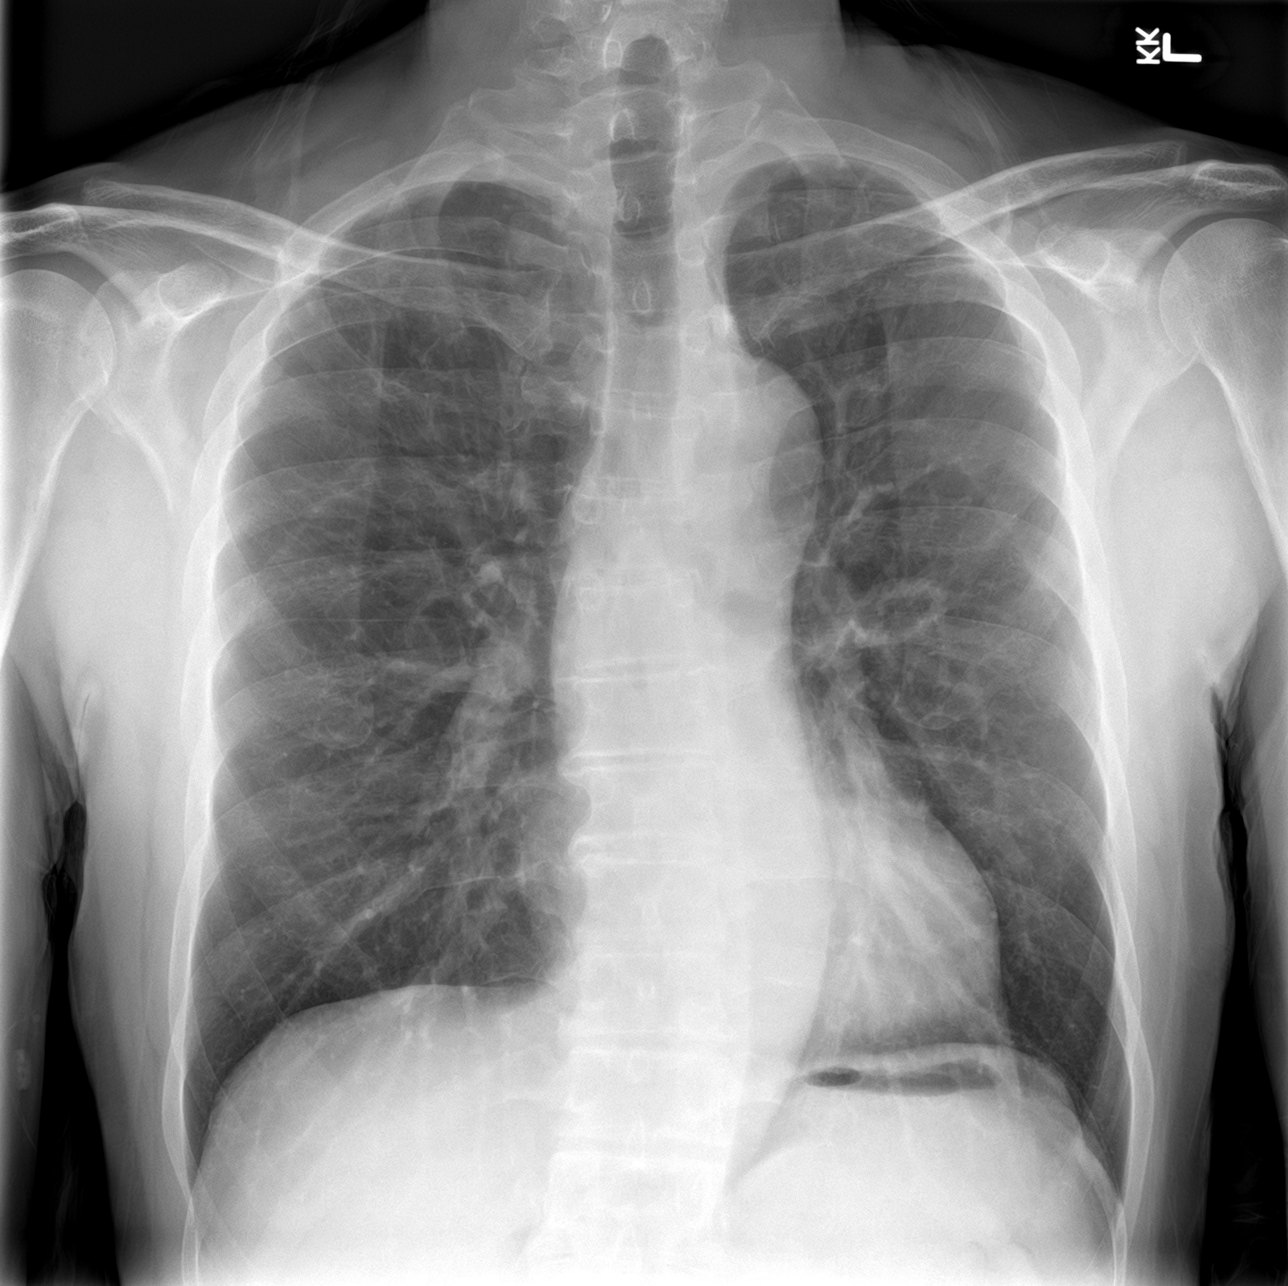

[chest lat (1 of 2)]
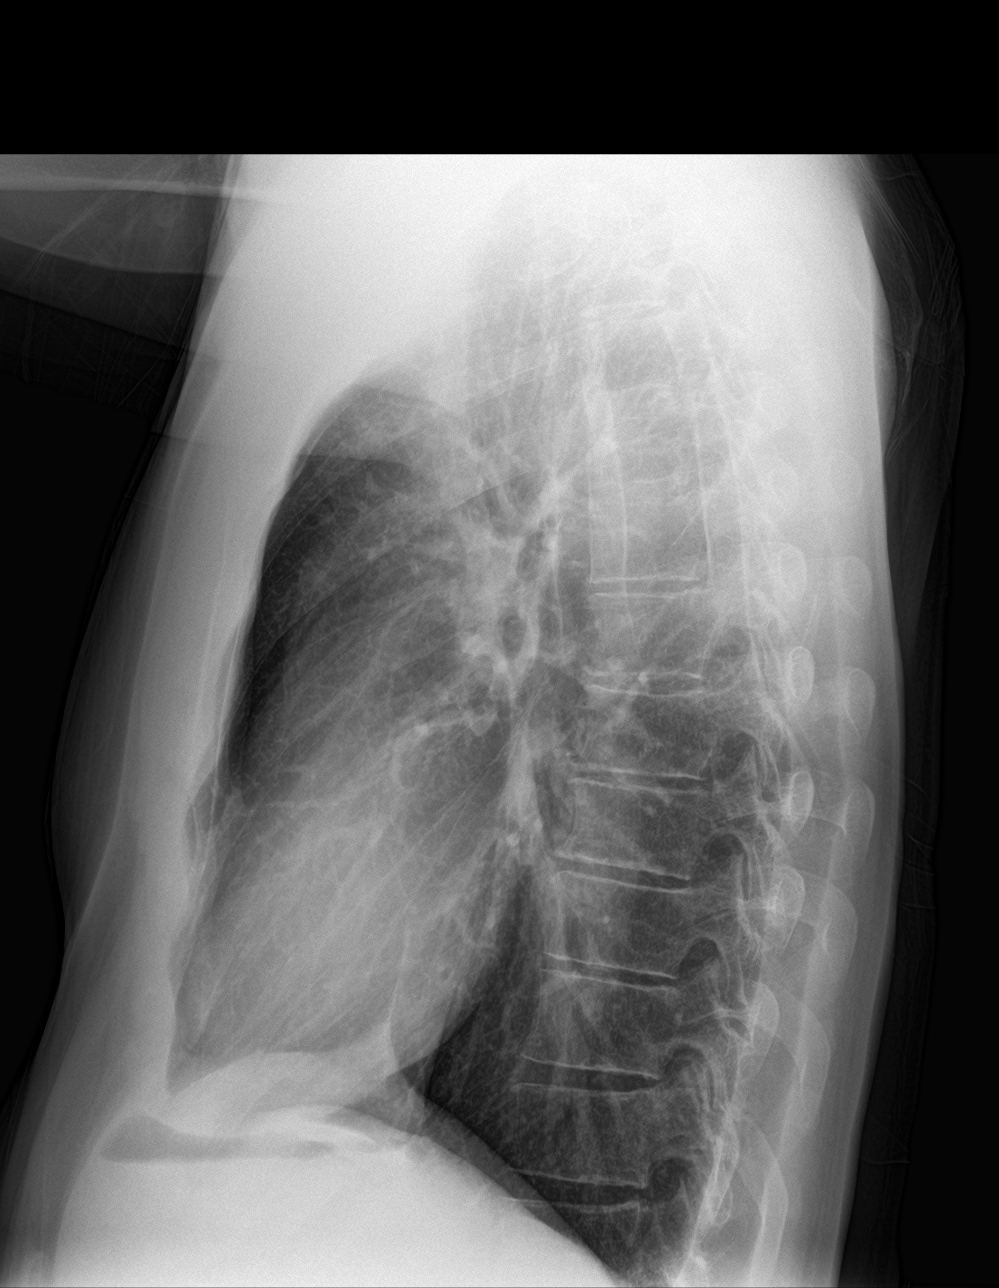

[chest lat (2 of 2)]
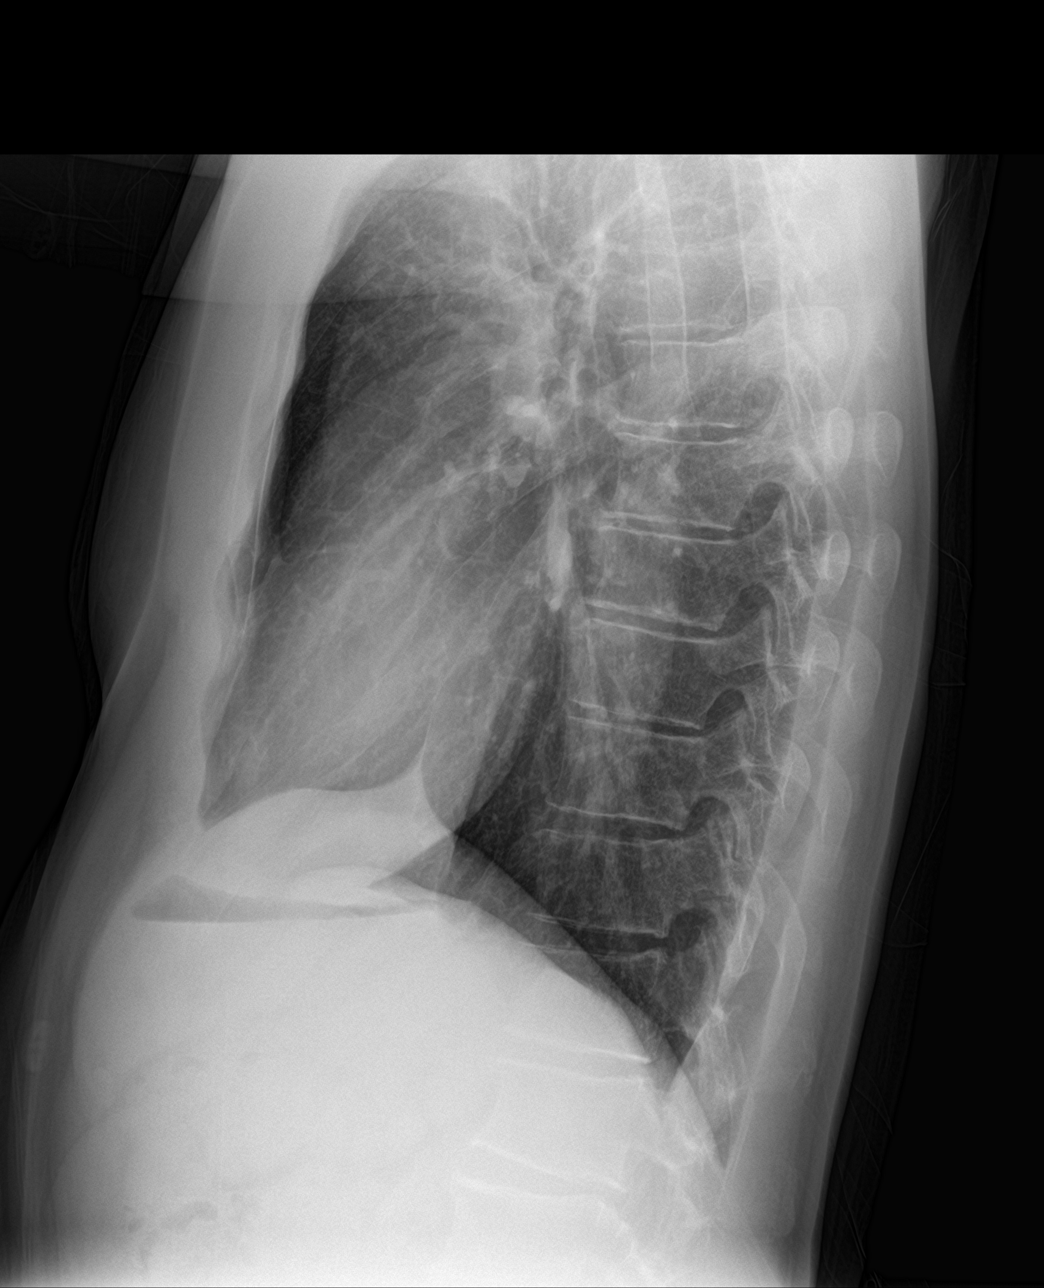

[3 of 3 positions shown; findings below may reference images not displayed]

FINDINGS: The cardiac silhouette, mediastinal and hilar contours are normal
and stable. The lungs are clear. No pleural effusions. No pulmonary
lesions. The bony thorax is intact.
IMPRESSION: No acute cardiopulmonary findings.

## 2022-05-08 NOTE — Assessment & Plan Note (Signed)
Managed by cardio. Just completed a holter monitor. Has a stress test scheduled.  ?EKG today stable from previous, normal sinus. ?May be attributed to anxiety/musculoskeletal pain? Advised stretching, warm compress. ?Gave ED precautions ? ? ?

## 2022-05-10 ENCOUNTER — Encounter: Payer: Self-pay | Admitting: Physician Assistant

## 2022-05-10 ENCOUNTER — Ambulatory Visit: Payer: Self-pay | Admitting: *Deleted

## 2022-05-10 ENCOUNTER — Telehealth: Payer: Self-pay | Admitting: Physician Assistant

## 2022-05-10 NOTE — Telephone Encounter (Signed)
Please review for Dr. Fisher.  

## 2022-05-10 NOTE — Telephone Encounter (Signed)
Called and spoke with pt's wife. Pt has been taking effexor for 2 days. Explained that most of Effexor XR's side effects typically go away after a week or two of taking the medication. Also, effexor is a safe drug to use for neuropathic pain. Pt's wife expressed understanding and agreed if the symptoms persist call us back or RTC In addition, explained that it will be inappropriate for me to Rx medication without seeing patient in person as "nerve pain" is a new complaint. Per chart review from 05/01/22,  nerve pain was resolved. Per pt, neurologist wanted to schedule MRI, but he wants to hold of on that since sx resolved.

## 2022-05-10 NOTE — Telephone Encounter (Signed)
Reason for Disposition  [1] Caller has URGENT medicine question about med that PCP or specialist prescribed AND [2] triager unable to answer question  Answer Assessment - Initial Assessment Questions 1. NAME of MEDICATION: "What medicine are you calling about?"     Dizzy since starting the Effexor.     Nerve pain is worse too.   Wife on phone but husband in background.  [name]. 2. QUESTION: "What is your question?" (e.g., double dose of medicine, side effect)     Wanted to let Dr. Sherrie Mustache know about these side effects from the Effexor. 3. PRESCRIBING HCP: "Who prescribed it?" Reason: if prescribed by specialist, call should be referred to that group.     Dr. Sherrie Mustache 4. SYMPTOMS: "Do you have any symptoms?"     Very dizzy and nerve pain is worse.    Chest is burning worse last night.  This has happened before with the nerve pain in chest and shoulders and neck.   Excedrin helped.  This happens with the nerve pain but it was worse last night. He is for an MRI of his head to see what is going on. 5. SEVERITY: If symptoms are present, ask "Are they mild, moderate or severe?"     Last night the nerve pain was real bad.  Could not sleep. 6. PREGNANCY:  "Is there any chance that you are pregnant?" "When was your last menstrual period?"     N/A  Protocols used: Medication Question Call-A-AH  Chief Complaint: Since starting the Effexor he has been very dizzy and the nerve pain is much worse. Symptoms: Could not sleep last night due to the nerve pain and burning in his chest was worse last night too.   This goes along with the nerve pain. Frequency: Since starting the Effexor. Pertinent Negatives: Patient denies that it is helping with the pain. Disposition: [] ED /[] Urgent Care (no appt availability in office) / [] Appointment(In office/virtual)/ []  [facility]/ [] Home Care/ [] Refused Recommended Disposition /[] [facility]/ [x]  Follow-up with PCP Additional Notes: High  priority message sent to [facility] for Dr. .

## 2022-05-10 NOTE — Telephone Encounter (Signed)
I called into [facility] to make sure the message came through and that a provider could address it since Dr. Caryn Section left early today.

## 2022-05-13 ENCOUNTER — Encounter: Payer: Self-pay | Admitting: Family Medicine

## 2022-05-21 DIAGNOSIS — I209 Angina pectoris, unspecified: Secondary | ICD-10-CM | POA: Diagnosis not present

## 2022-05-21 DIAGNOSIS — I480 Paroxysmal atrial fibrillation: Secondary | ICD-10-CM | POA: Diagnosis not present

## 2022-05-21 DIAGNOSIS — I471 Supraventricular tachycardia: Secondary | ICD-10-CM | POA: Diagnosis not present

## 2022-05-21 DIAGNOSIS — R002 Palpitations: Secondary | ICD-10-CM | POA: Diagnosis not present

## 2022-05-22 NOTE — Progress Notes (Unsigned)
      Established patient visit   Patient: Peter Wyatt   DOB: 1966-09-24   56 y.o. Male  MRN: [id number] Visit Date: 05/23/2022  Today's healthcare provider: Alfredia Ferguson, PA-C   No chief complaint on file.  Subjective    HPI  Patient reports pain on left side under ribs and middle of back and neck. Wondering if he has infection.  Medications: Outpatient Medications Prior to Visit  Medication Sig   aspirin-acetaminophen-caffeine (EXCEDRIN MIGRAINE) 250-250-65 MG tablet Take 1 tablet by mouth every 6 (six) hours as needed for headache.   diltiazem (CARDIZEM CD) 240 MG 24 hr capsule Take 1 capsule by mouth once daily   lisinopril (ZESTRIL) 20 MG tablet Take 1 tablet (20 mg total) by mouth daily.   naproxen (NAPROSYN) 250 MG tablet Take 2 tablets (500 mg total) by mouth 2 (two) times daily with a meal.   nitroGLYCERIN (NITROSTAT) 0.4 MG SL tablet Place 1 tablet (0.4 mg total) under the tongue every 5 (five) minutes as needed for chest pain. Call 9-1-1 if you take the 3rd dose. (Patient not taking: Reported on 05/08/2022)   potassium chloride SA (KLOR-CON) 20 MEQ tablet Take 20 mEq by mouth 2 (two) times daily.   venlafaxine XR (EFFEXOR XR) 37.5 MG 24 hr capsule Take 1 capsule (37.5 mg total) by mouth daily with breakfast. (Patient not taking: Reported on 05/08/2022)   vitamin B-12 (CYANOCOBALAMIN) 1000 MCG tablet Take 1,000 mcg by mouth daily.   No facility-administered medications prior to visit.    Review of Systems  {Labs  Heme  Chem  Endocrine  Serology  Results Review (optional):23779}   Objective    There were no vitals taken for this visit. {Show previous vital signs (optional):23777}  Physical Exam  ***  No results found for any visits on 05/23/22.  Assessment & Plan     ***  No follow-ups on file.      {provider attestation***:1}   Alfredia Ferguson, PA-C  [facility] [phone] (phone) [phone] (fax)  [facility]

## 2022-05-23 ENCOUNTER — Encounter: Payer: Self-pay | Admitting: Physician Assistant

## 2022-05-23 ENCOUNTER — Ambulatory Visit: Payer: BC Managed Care – PPO | Admitting: Physician Assistant

## 2022-05-23 VITALS — BP 123/75 | HR 74 | Ht 71.0 in | Wt 170.5 lb

## 2022-05-23 DIAGNOSIS — R221 Localized swelling, mass and lump, neck: Secondary | ICD-10-CM | POA: Diagnosis not present

## 2022-05-23 DIAGNOSIS — R1012 Left upper quadrant pain: Secondary | ICD-10-CM | POA: Insufficient documentation

## 2022-05-23 MED ORDER — SUCRALFATE 1 G PO TABS: 1.0000 g | ORAL_TABLET | Freq: Three times a day (TID) | ORAL | 0 refills | Status: DC

## 2022-05-23 NOTE — Assessment & Plan Note (Signed)
Nothing palpated on exam Advised if there is anything of concern MRI would see-- if returns can make appoint for evaluation

## 2022-05-23 NOTE — Assessment & Plan Note (Signed)
Nontender to palpation Advised starting omeprazole 40 mg daily, in AM 30 minutes before meals -- pt states he has some OTC at home he can take. Will add carafate prn meals and bedtime short term  If no improvement would recommend endoscopy

## 2022-05-24 ENCOUNTER — Other Ambulatory Visit: Payer: Self-pay | Admitting: Physician Assistant

## 2022-05-24 ENCOUNTER — Encounter: Payer: Self-pay | Admitting: Physician Assistant

## 2022-05-24 DIAGNOSIS — R5383 Other fatigue: Secondary | ICD-10-CM

## 2022-05-27 ENCOUNTER — Ambulatory Visit
Admission: RE | Admit: 2022-05-27 | Discharge: 2022-05-27 | Disposition: A | Discharge status: Home / Self Care | Payer: BC Managed Care – PPO | Source: Ambulatory Visit | Attending: Student | Admitting: Student

## 2022-05-27 DIAGNOSIS — J341 Cyst and mucocele of nose and nasal sinus: Secondary | ICD-10-CM | POA: Diagnosis not present

## 2022-05-27 DIAGNOSIS — R202 Paresthesia of skin: Secondary | ICD-10-CM | POA: Diagnosis not present

## 2022-05-27 DIAGNOSIS — I10 Essential (primary) hypertension: Secondary | ICD-10-CM | POA: Diagnosis not present

## 2022-05-27 DIAGNOSIS — R5383 Other fatigue: Secondary | ICD-10-CM | POA: Diagnosis not present

## 2022-05-27 DIAGNOSIS — R200 Anesthesia of skin: Secondary | ICD-10-CM | POA: Diagnosis not present

## 2022-05-28 LAB — IRON,TIBC AND FERRITIN PANEL
Ferritin: 132 ng/mL (ref 30–400)
Iron Saturation: 35 % (ref 15–55)
Iron: 106 ug/dL (ref 38–169)
Total Iron Binding Capacity: 306 ug/dL (ref 250–450)
UIBC: 200 ug/dL (ref 111–343)

## 2022-05-31 DIAGNOSIS — M542 Cervicalgia: Secondary | ICD-10-CM | POA: Diagnosis not present

## 2022-05-31 DIAGNOSIS — R200 Anesthesia of skin: Secondary | ICD-10-CM | POA: Diagnosis not present

## 2022-05-31 DIAGNOSIS — G5603 Carpal tunnel syndrome, bilateral upper limbs: Secondary | ICD-10-CM | POA: Diagnosis not present

## 2022-05-31 DIAGNOSIS — G5732 Lesion of lateral popliteal nerve, left lower limb: Secondary | ICD-10-CM | POA: Diagnosis not present

## 2022-06-05 ENCOUNTER — Other Ambulatory Visit: Payer: Self-pay | Admitting: Student

## 2022-06-05 DIAGNOSIS — R200 Anesthesia of skin: Secondary | ICD-10-CM

## 2022-06-05 DIAGNOSIS — G5603 Carpal tunnel syndrome, bilateral upper limbs: Secondary | ICD-10-CM

## 2022-06-19 DIAGNOSIS — I480 Paroxysmal atrial fibrillation: Secondary | ICD-10-CM | POA: Diagnosis not present

## 2022-06-19 DIAGNOSIS — R002 Palpitations: Secondary | ICD-10-CM | POA: Diagnosis not present

## 2022-06-19 DIAGNOSIS — I10 Essential (primary) hypertension: Secondary | ICD-10-CM | POA: Diagnosis not present

## 2022-06-19 DIAGNOSIS — I209 Angina pectoris, unspecified: Secondary | ICD-10-CM | POA: Diagnosis not present

## 2022-06-27 ENCOUNTER — Ambulatory Visit
Admission: RE | Admit: 2022-06-27 | Discharge: 2022-06-27 | Disposition: A | Discharge status: Home / Self Care | Payer: BC Managed Care – PPO | Source: Ambulatory Visit | Attending: Student | Admitting: Student

## 2022-06-27 DIAGNOSIS — M47812 Spondylosis without myelopathy or radiculopathy, cervical region: Secondary | ICD-10-CM | POA: Diagnosis not present

## 2022-06-27 DIAGNOSIS — Q7649 Other congenital malformations of spine, not associated with scoliosis: Secondary | ICD-10-CM | POA: Diagnosis not present

## 2022-06-27 DIAGNOSIS — R200 Anesthesia of skin: Secondary | ICD-10-CM | POA: Diagnosis not present

## 2022-06-27 DIAGNOSIS — R202 Paresthesia of skin: Secondary | ICD-10-CM

## 2022-06-27 DIAGNOSIS — G5603 Carpal tunnel syndrome, bilateral upper limbs: Secondary | ICD-10-CM

## 2022-06-27 MED ORDER — GADOBENATE DIMEGLUMINE 529 MG/ML IV SOLN
15.0000 mL | Freq: Once | INTRAVENOUS | Status: AC | PRN
  Administered 2022-06-27: 15 mL via INTRAVENOUS

## 2022-06-27 MED ORDER — GADOBENATE DIMEGLUMINE 529 MG/ML IV SOLN
15.0000 mL | Freq: Once | INTRAVENOUS | Status: DC | PRN
Start: 2022-06-27 — End: 2022-06-28

## 2022-07-24 DIAGNOSIS — J301 Allergic rhinitis due to pollen: Secondary | ICD-10-CM | POA: Diagnosis not present

## 2022-07-24 DIAGNOSIS — J342 Deviated nasal septum: Secondary | ICD-10-CM | POA: Diagnosis not present

## 2022-08-01 ENCOUNTER — Other Ambulatory Visit: Payer: Self-pay | Admitting: Family Medicine

## 2022-08-01 DIAGNOSIS — R972 Elevated prostate specific antigen [PSA]: Secondary | ICD-10-CM

## 2022-08-05 ENCOUNTER — Other Ambulatory Visit: Payer: Self-pay

## 2022-08-05 DIAGNOSIS — R972 Elevated prostate specific antigen [PSA]: Secondary | ICD-10-CM

## 2022-08-06 ENCOUNTER — Other Ambulatory Visit
Admission: RE | Admit: 2022-08-06 | Discharge: 2022-08-06 | Disposition: A | Discharge status: Home / Self Care | Payer: BC Managed Care – PPO | Attending: Urology | Admitting: Urology

## 2022-08-06 ENCOUNTER — Other Ambulatory Visit: Payer: BC Managed Care – PPO

## 2022-08-06 DIAGNOSIS — R972 Elevated prostate specific antigen [PSA]: Secondary | ICD-10-CM | POA: Insufficient documentation

## 2022-08-06 LAB — PSA: Prostatic Specific Antigen: 2.12 ng/mL (ref 0.00–4.00)

## 2022-08-09 ENCOUNTER — Ambulatory Visit: Payer: BC Managed Care – PPO | Admitting: Urology

## 2022-08-12 ENCOUNTER — Ambulatory Visit: Payer: BC Managed Care – PPO | Admitting: Urology

## 2022-08-22 ENCOUNTER — Encounter: Payer: Self-pay | Admitting: Urology

## 2022-08-22 ENCOUNTER — Ambulatory Visit (INDEPENDENT_AMBULATORY_CARE_PROVIDER_SITE_OTHER): Payer: BC Managed Care – PPO | Admitting: Urology

## 2022-08-22 VITALS — BP 145/94 | HR 70 | Ht 71.0 in | Wt 170.0 lb

## 2022-08-22 DIAGNOSIS — N401 Enlarged prostate with lower urinary tract symptoms: Secondary | ICD-10-CM | POA: Diagnosis not present

## 2022-08-22 DIAGNOSIS — R361 Hematospermia: Secondary | ICD-10-CM

## 2022-08-22 DIAGNOSIS — R972 Elevated prostate specific antigen [PSA]: Secondary | ICD-10-CM | POA: Diagnosis not present

## 2022-08-22 NOTE — Progress Notes (Signed)
08/22/2022 8:32 AM   [name] 1966-12-17 [id number]  Referring provider: Malva Limes, MD [address] [place],  [place] [address]  Chief Complaint  Patient presents with   Elevated PSA    Urologic history: 1.  Hematospermia  2.  Abnormal PSA velocity PSA bump 0.5-1.5 Prostate MRI 12/2021 PI-RADS 3 lesion right anterior transition zone; 30 cc prostate MRI fusion biopsy 02/01/2022 16/16 cores negative   HPI: 56 y.o. male presents for 67-month follow-up.  Doing well since last visit No bothersome LUTS Still with intermittent hematospermia Denies flank, abdominal or pelvic pain PSA 08/06/2022 2.12 (PSA density 0.07)   PMH: Past Medical History:  Diagnosis Date   Atrial fibrillation (HCC)    Hypertension     Surgical History: No past surgical history on file.  Home Medications:  Allergies as of 08/22/2022   No Known Allergies      Medication List        Accurate as of August 22, 2022  8:32 AM. If you have any questions, ask your nurse or doctor.          aspirin-acetaminophen-caffeine 250-250-65 MG tablet Commonly known as: EXCEDRIN MIGRAINE Take 1 tablet by mouth every 6 (six) hours as needed for headache.   cyanocobalamin 1000 MCG tablet Commonly known as: VITAMIN B12 Take 1,000 mcg by mouth daily.   diltiazem 240 MG 24 hr capsule Commonly known as: CARDIZEM CD Take 1 capsule by mouth once daily   lisinopril 20 MG tablet Commonly known as: ZESTRIL Take 1 tablet (20 mg total) by mouth daily.   naproxen 250 MG tablet Commonly known as: NAPROSYN Take 2 tablets (500 mg total) by mouth 2 (two) times daily with a meal.   nitroGLYCERIN 0.4 MG SL tablet Commonly known as: NITROSTAT Place 1 tablet (0.4 mg total) under the tongue every 5 (five) minutes as needed for chest pain. Call 9-1-1 if you take the 3rd dose.   potassium chloride SA 20 MEQ tablet Commonly known as: KLOR-CON M Take 20 mEq by mouth 2 (two) times  daily.   sucralfate 1 g tablet Commonly known as: CARAFATE Take 1 tablet (1 g total) by mouth 4 (four) times daily -  with meals and at bedtime for 10 days.        Allergies: No Known Allergies  Family History: Family History  Problem Relation Age of Onset   Hypertension Father    Prostate cancer Father    Prostate cancer Paternal Grandfather    Colon cancer Neg Hx     Social History:  reports that he has never smoked. He has never used smokeless tobacco. He reports that he does not drink alcohol and does not use drugs.   Physical Exam: BP (!) 145/94   Pulse 70   Ht 5\' 11"  (1.803 m)   Wt 170 lb (77.1 kg)   BMI 23.71 kg/m   Constitutional:  Alert and oriented, No acute distress. HEENT: [place] AT Respiratory: Normal respiratory effort, no increased work of breathing. Neurologic: Grossly intact, no focal deficits, moving all 4 extremities. Psychiatric: Normal mood and affect.   Assessment & Plan:    1.  BPH with mild LUTS Symptoms not bothersome  2.  Hematospermia Intermittent MRI with PI-RADS 3 lesion with recent negative biopsy  3.  Abnormal PSA velocity PSA has increased but with recent indeterminant MRI and negative biopsy we will continue to follow PSA density is low Recommend 65-month follow-up with PSA/DRE   8-month  Arnaldo Natal, MD  [facility] [address] [place], [place] [address] [phone]

## 2022-09-20 ENCOUNTER — Encounter: Payer: Self-pay | Admitting: Urology

## 2022-10-17 DIAGNOSIS — I10 Essential (primary) hypertension: Secondary | ICD-10-CM | POA: Diagnosis not present

## 2022-10-17 DIAGNOSIS — I209 Angina pectoris, unspecified: Secondary | ICD-10-CM | POA: Diagnosis not present

## 2022-10-17 DIAGNOSIS — R002 Palpitations: Secondary | ICD-10-CM | POA: Diagnosis not present

## 2022-10-17 DIAGNOSIS — I480 Paroxysmal atrial fibrillation: Secondary | ICD-10-CM | POA: Diagnosis not present

## 2022-11-18 DIAGNOSIS — M542 Cervicalgia: Secondary | ICD-10-CM | POA: Diagnosis not present

## 2022-11-18 DIAGNOSIS — G5603 Carpal tunnel syndrome, bilateral upper limbs: Secondary | ICD-10-CM | POA: Diagnosis not present

## 2022-11-18 DIAGNOSIS — R202 Paresthesia of skin: Secondary | ICD-10-CM | POA: Diagnosis not present

## 2022-11-18 DIAGNOSIS — R200 Anesthesia of skin: Secondary | ICD-10-CM | POA: Diagnosis not present

## 2023-01-08 ENCOUNTER — Other Ambulatory Visit: Payer: Self-pay | Admitting: Family Medicine

## 2023-01-08 DIAGNOSIS — I10 Essential (primary) hypertension: Secondary | ICD-10-CM

## 2023-02-03 ENCOUNTER — Other Ambulatory Visit: Payer: Self-pay | Admitting: Family Medicine

## 2023-02-03 DIAGNOSIS — I10 Essential (primary) hypertension: Secondary | ICD-10-CM

## 2023-02-04 NOTE — Telephone Encounter (Signed)
Requested medication (s) are due for refill today: yes  Requested medication (s) are on the active medication list: yes  Last refill:  01/08/23  Future visit scheduled: no  Notes to clinic:  Unable to refill per protocol, courtesy refill already given, routing for provider approval.      Requested Prescriptions  Pending Prescriptions Disp Refills   lisinopril (ZESTRIL) 20 MG tablet [Pharmacy Med Name: Lisinopril 20 MG Oral Tablet] 30 tablet 0    Sig: TAKE 1 TABLET BY MOUTH ONCE DAILY. NEED APPT FOR REFILL     Cardiovascular:  ACE Inhibitors Failed - 02/03/2023  9:35 AM      Failed - Cr in normal range and within 180 days    Creatinine, Ser  Date Value Ref Range Status  04/24/2022 0.97 0.61 - 1.24 mg/dL Final         Failed - K in normal range and within 180 days    Potassium  Date Value Ref Range Status  04/24/2022 3.5 3.5 - 5.1 mmol/L Final         Failed - Last BP in normal range    BP Readings from Last 1 Encounters:  08/22/22 (!) 145/94         Failed - Valid encounter within last 6 months    Recent Outpatient Visits           8 months ago LUQ abdominal pain   [facility] [name], PA-C   9 months ago Angina pectoris [facility])   [place] [name], PA-C   9 months ago [place], Donald E, MD   10 months ago Situational anxiety   [facility] [name], PA-C   10 months ago Nerve pain   [place] Gwyneth Sprout, FNP       Future Appointments             In 1 month Stoioff, Ronda Fairly, MD [facility] - Patient is not pregnant

## 2023-02-12 ENCOUNTER — Ambulatory Visit: Payer: BC Managed Care – PPO | Admitting: Physician Assistant

## 2023-02-12 ENCOUNTER — Encounter: Payer: Self-pay | Admitting: Physician Assistant

## 2023-02-12 VITALS — BP 134/86 | Ht 71.0 in | Wt 176.0 lb

## 2023-02-12 DIAGNOSIS — I480 Paroxysmal atrial fibrillation: Secondary | ICD-10-CM

## 2023-02-12 DIAGNOSIS — I10 Essential (primary) hypertension: Secondary | ICD-10-CM | POA: Diagnosis not present

## 2023-02-12 MED ORDER — DILTIAZEM HCL ER COATED BEADS 240 MG PO CP24: 240.0000 mg | ORAL_CAPSULE | Freq: Every day | ORAL | 3 refills | Status: DC

## 2023-02-12 MED ORDER — LISINOPRIL 20 MG PO TABS: 20.0000 mg | ORAL_TABLET | Freq: Every day | ORAL | 3 refills | Status: DC

## 2023-02-12 NOTE — Assessment & Plan Note (Signed)
History of PAF in 2019 SR with diltiazem. History of SVT episodes controlled with dilt as well.  No anticoag as CHA2DS2/VAS was 0

## 2023-02-12 NOTE — Progress Notes (Signed)
Established patient visit   Patient: Peter Wyatt   DOB: [birth year]   57 y.o. Male  MRN: [id number] Visit Date: 02/12/2023  Today's healthcare provider: [name], PA-C   Cc. Htn f/u  Subjective    HPI  Hypertension, follow-up  BP Readings from Last 3 Encounters:  02/12/23 134/86  08/22/22 (!) 145/94  05/23/22 123/75   Wt Readings from Last 3 Encounters:  02/12/23 176 lb (79.8 kg)  08/22/22 170 lb (77.1 kg)  05/23/22 170 lb 8 oz (77.3 kg)     He was last seen for hypertension 10/2021. Pt is managed on diltiazem and lisinopril  He reports excellent compliance with treatment. He is not having side effects.   He is exercising. He does not smoke.  Outside blood pressures are 120s/80s.   Denies peripheral edema, chest pain.  Pertinent labs Lab Results  Component Value Date   CHOL 142 01/08/2022   HDL 53 01/08/2022   LDLCALC 78 01/08/2022   TRIG 49 01/08/2022   CHOLHDL 2.7 01/08/2022   Lab Results  Component Value Date   NA 137 04/24/2022   K 3.5 04/24/2022   CREATININE 0.97 04/24/2022   GFRNONAA >60 04/24/2022   GLUCOSE 196 (H) 04/24/2022   TSH 2.200 03/29/2022     The 10-year ASCVD risk score (Arnett DK, et al., 2019) is: 4.5%  ---------------------------------------------------------------------------------------------------   Medications: Outpatient Medications Prior to Visit  Medication Sig   aspirin-acetaminophen-caffeine (EXCEDRIN MIGRAINE) 250-250-65 MG tablet Take 1 tablet by mouth every 6 (six) hours as needed for headache.   naproxen (NAPROSYN) 250 MG tablet Take 2 tablets (500 mg total) by mouth 2 (two) times daily with a meal.   nitroGLYCERIN (NITROSTAT) 0.4 MG SL tablet Place 1 tablet (0.4 mg total) under the tongue every 5 (five) minutes as needed for chest pain. Call 9-1-1 if you take the 3rd dose.   potassium chloride SA (KLOR-CON) 20 MEQ tablet Take 20 mEq by mouth 2 (two) times daily.   [DISCONTINUED] diltiazem  (CARDIZEM CD) 240 MG 24 hr capsule Take 1 capsule by mouth once daily   [DISCONTINUED] lisinopril (ZESTRIL) 20 MG tablet Take 1 tablet by mouth once daily   lamoTRIgine (LAMICTAL) 25 MG tablet Take 25 mg by mouth daily. Patient is tapering off   sucralfate (CARAFATE) 1 g tablet Take 1 tablet (1 g total) by mouth 4 (four) times daily -  with meals and at bedtime for 10 days.   vitamin B-12 (CYANOCOBALAMIN) 1000 MCG tablet Take 1,000 mcg by mouth daily. (Patient not taking: Reported on 02/12/2023)   No facility-administered medications prior to visit.    Review of Systems  Constitutional:  Negative for fatigue and fever.  Respiratory:  Negative for cough and shortness of breath.   Cardiovascular:  Negative for chest pain, palpitations and leg swelling.  Neurological:  Negative for dizziness and headaches.      Objective    BP 134/86 (BP Location: Right Arm, Patient Position: Sitting, Cuff Size: Normal)   Ht 5' 11"$  (1.803 m)   Wt 176 lb (79.8 kg)   BMI 24.55 kg/m   Physical Exam Constitutional:      General: He is awake.     Appearance: He is well-developed.  HENT:     Head: Normocephalic.  Eyes:     Conjunctiva/sclera: Conjunctivae normal.  Cardiovascular:     Rate and Rhythm: Normal rate and regular rhythm.     Heart sounds: Normal heart sounds.  Pulmonary:  Effort: Pulmonary effort is normal.     Breath sounds: Normal breath sounds.  Musculoskeletal:     Right lower leg: No edema.     Left lower leg: No edema.  Skin:    General: Skin is warm.  Neurological:     Mental Status: He is alert and oriented to person, place, and time.  Psychiatric:        Attention and Perception: Attention normal.        Mood and Affect: Mood normal.        Speech: Speech normal.        Behavior: Behavior is cooperative.      No results found for any visits on 02/12/23.  Assessment & Plan     Problem List Items Addressed This Visit       Cardiovascular and Mediastinum    Hypertension - Primary    Chronic and well controlled Continue lisinopril 20 mg  F/u 6 mo .       Relevant Medications   lisinopril (ZESTRIL) 20 MG tablet   diltiazem (CARDIZEM CD) 240 MG 24 hr capsule   Paroxysmal atrial fibrillation (HCC)    History of PAF in 2019 SR with diltiazem. History of SVT episodes controlled with dilt as well.  No anticoag as CHA2DS2/VAS was 0         Relevant Medications   lisinopril (ZESTRIL) 20 MG tablet   diltiazem (CARDIZEM CD) 240 MG 24 hr capsule     Return in about 3 months (around 05/13/2023) for CPE.     I, [name], PA-C have reviewed all documentation for this visit. The documentation on 02/12/23  for the exam, diagnosis, procedures, and orders are all accurate and complete.  [name], PA-C [facility] [address] [place], [place], [address] Office: [phone] Fax: [phone]

## 2023-02-12 NOTE — Assessment & Plan Note (Signed)
Chronic and well controlled Continue lisinopril 20 mg  F/u 6 mo .

## 2023-02-20 ENCOUNTER — Other Ambulatory Visit: Payer: BC Managed Care – PPO

## 2023-02-24 ENCOUNTER — Ambulatory Visit: Payer: BC Managed Care – PPO | Admitting: Urology

## 2023-03-05 ENCOUNTER — Other Ambulatory Visit: Payer: BC Managed Care – PPO

## 2023-03-05 DIAGNOSIS — R972 Elevated prostate specific antigen [PSA]: Secondary | ICD-10-CM

## 2023-03-06 LAB — PSA: Prostate Specific Ag, Serum: 1.4 ng/mL (ref 0.0–4.0)

## 2023-03-10 ENCOUNTER — Ambulatory Visit: Payer: BC Managed Care – PPO | Admitting: Urology

## 2023-03-10 ENCOUNTER — Encounter: Payer: Self-pay | Admitting: Urology

## 2023-03-10 VITALS — BP 138/85 | HR 75 | Ht 71.0 in | Wt 172.0 lb

## 2023-03-10 DIAGNOSIS — K644 Residual hemorrhoidal skin tags: Secondary | ICD-10-CM

## 2023-03-10 DIAGNOSIS — Z125 Encounter for screening for malignant neoplasm of prostate: Secondary | ICD-10-CM | POA: Diagnosis not present

## 2023-03-10 DIAGNOSIS — R361 Hematospermia: Secondary | ICD-10-CM

## 2023-03-10 DIAGNOSIS — N401 Enlarged prostate with lower urinary tract symptoms: Secondary | ICD-10-CM

## 2023-03-10 MED ORDER — HYDROCORTISONE (PERIANAL) 2.5 % EX CREA: TOPICAL_CREAM | CUTANEOUS | 0 refills | Status: AC

## 2023-03-10 NOTE — Progress Notes (Signed)
03/10/2023 8:59 AM   Peter Wyatt February 14, 1966 [id number]  Referring provider: Birdie Sons, MD [address] [place],  [place] [address]  Chief Complaint  Patient presents with   Elevated PSA    Urologic history: 1.  Hematospermia  2.  Abnormal PSA velocity PSA bump 0.5-1.5 Prostate MRI 12/2021 PI-RADS 3 lesion right anterior transition zone; 30 cc prostate MRI fusion biopsy 02/01/2022 16/16 cores negative   HPI: 57 y.o. male presents for 6 month follow-up.  Doing well since last visit No bothersome LUTS Several months since last episode hematospermia Denies flank, abdominal or pelvic pain Thinks he may have an external hemorrhoid that he wants to have checked PSA 03/05/2023 back to baseline at 1.4.   PMH: Past Medical History:  Diagnosis Date   Atrial fibrillation ([place])    Hypertension     Surgical History: No past surgical history on file.  Home Medications:  Allergies as of 03/10/2023   No Known Allergies      Medication List        Accurate as of March 10, 2023  8:59 AM. If you have any questions, ask your nurse or doctor.          STOP taking these medications    naproxen 250 MG tablet Commonly known as: NAPROSYN Stopped by: Abbie Sons, MD   sucralfate 1 g tablet Commonly known as: CARAFATE Stopped by: Abbie Sons, MD       TAKE these medications    aspirin-acetaminophen-caffeine 250-250-65 MG tablet Commonly known as: EXCEDRIN MIGRAINE Take 1 tablet by mouth every 6 (six) hours as needed for headache.   cyanocobalamin 1000 MCG tablet Commonly known as: VITAMIN B12 Take 1,000 mcg by mouth daily.   diltiazem 300 MG 24 hr capsule Commonly known as: CARDIZEM CD Take 300 mg by mouth daily. What changed: Another medication with the same name was removed. Continue taking this medication, and follow the directions you see here. Changed by: Abbie Sons, MD   lamoTRIgine 25 MG tablet Commonly known as:  LAMICTAL Take 25 mg by mouth daily. Patient is tapering off   lisinopril 20 MG tablet Commonly known as: ZESTRIL Take 1 tablet (20 mg total) by mouth daily.   nitroGLYCERIN 0.4 MG SL tablet Commonly known as: NITROSTAT Place 1 tablet (0.4 mg total) under the tongue every 5 (five) minutes as needed for chest pain. Call 9-1-1 if you take the 3rd dose.   potassium chloride 10 MEQ tablet Commonly known as: KLOR-CON Take 1 tablet by mouth 2 (two) times daily.   potassium chloride SA 20 MEQ tablet Commonly known as: KLOR-CON M Take 20 mEq by mouth 2 (two) times daily.        Allergies: No Known Allergies  Family History: Family History  Problem Relation Age of Onset   Hypertension Father    Prostate cancer Father    Prostate cancer Paternal Grandfather    Colon cancer Neg Hx     Social History:  reports that he has never smoked. He has never used smokeless tobacco. He reports that he does not drink alcohol and does not use drugs.   Physical Exam: BP 138/85 (BP Location: Left Arm, Patient Position: Sitting, Cuff Size: Large)   Pulse 75   Ht 5\' 11"  (1.803 m)   Wt 172 lb (78 kg)   BMI 23.99 kg/m   Constitutional:  Alert and oriented, No acute distress. HEENT: [place] AT Respiratory: Normal respiratory effort, no  increased work of breathing. Neurologic: Grossly intact, no focal deficits, moving all 4 extremities. GU: Prostate 30 g, smooth without nodules.  Small external hemorrhoid inferiorly Psychiatric: Normal mood and affect.   Assessment & Plan:    1.  BPH with mild LUTS Symptoms not bothersome  2.  Hematospermia Significant improved  3.  Abnormal PSA velocity PSA back to baseline at 1.4 1 year follow-up with PSA  4.  External hemorrhoid Rx Anusol HC   Abbie Sons, MD  [facility] [address] [place], [place] [address] [phone]

## 2023-05-08 DIAGNOSIS — L821 Other seborrheic keratosis: Secondary | ICD-10-CM | POA: Diagnosis not present

## 2023-05-08 DIAGNOSIS — D229 Melanocytic nevi, unspecified: Secondary | ICD-10-CM | POA: Diagnosis not present

## 2023-06-04 DIAGNOSIS — M79671 Pain in right foot: Secondary | ICD-10-CM | POA: Diagnosis not present

## 2023-06-04 DIAGNOSIS — M898X9 Other specified disorders of bone, unspecified site: Secondary | ICD-10-CM | POA: Diagnosis not present

## 2023-06-04 DIAGNOSIS — M205X1 Other deformities of toe(s) (acquired), right foot: Secondary | ICD-10-CM | POA: Diagnosis not present

## 2023-07-10 DIAGNOSIS — R002 Palpitations: Secondary | ICD-10-CM | POA: Diagnosis not present

## 2023-09-17 DIAGNOSIS — L821 Other seborrheic keratosis: Secondary | ICD-10-CM | POA: Diagnosis not present

## 2023-09-17 DIAGNOSIS — L298 Other pruritus: Secondary | ICD-10-CM | POA: Diagnosis not present

## 2023-11-12 ENCOUNTER — Ambulatory Visit: Payer: Self-pay

## 2023-11-12 NOTE — Telephone Encounter (Signed)
  Chief Complaint: HTN Symptoms: BP been elevated a little more than normal Frequency: several weeks since stopping lisinopril Pertinent Negatives: Patient denies any sx Disposition: [] ED /[] Urgent Care (no appt availability in office) / [x] Appointment(In office/virtual)/ []  [facility]/ [] Home Care/ [] Refused Recommended Disposition /[] [facility]/ []  Follow-up with PCP Additional Notes:spoke with p;s wife Peter Wyatt, she states Peter Wyatt is just wanting reassurance that BP is ok, stopped lisinopril 1 month ago and BP has been up and down. Peter Wyatt wanting to have BP checked manually. Scheduled OV for 11/26/23 at 0800 with Dr. Roxan Hockey.   Summary: BP readings   Peter Wyatt's wife called back with BP numbers for Peter Wyatt, She states this morning reading was 138/92,  then , 124/86 , then evening 137/88, I let her know doesn't mean danger zone according to red words, but still wants sooner appt then Dec 2  ----- Message from Shon Hale sent at 11/12/2023  4:23 PM EST ----- Wife, Peter Wyatt, calling on behalf of patient. Would like to have blood pressure checked, Peter Wyatt checks it daily at home has noticed the bottom number higher than normal. Peter Wyatt checked blood pressure this morning, wife unaware of exact numbers. Peter Wyatt stopped taking the lisinopril about a month ago.  Peter Wyatt currently driving for work, please reach out to wife Peter Wyatt.         Reason for Disposition  Wants doctor to measure BP  Answer Assessment - Initial Assessment Questions 1. BLOOD PRESSURE: "What is the blood pressure?" "Did you take at least two measurements 5 minutes apart?"     138/92, 124/86, 137/88 2. ONSET: "When did you take your blood pressure?"     Several weeks going up and down  3. HOW: "How did you take your blood pressure?" (e.g., automatic home BP monitor, visiting nurse)     Home BPs 4. HISTORY: "Do you have a history of high blood pressure?"     yes 5. MEDICINES: "Are you taking any medicines for blood pressure?" "Have you missed  any doses recently?"     Yes and recently stopped lisinopril  6. OTHER SYMPTOMS: "Do you have any symptoms?" (e.g., blurred vision, chest pain, difficulty breathing, headache, weakness)     no  Protocols used: Blood Pressure - High-A-AH

## 2023-11-24 NOTE — Progress Notes (Unsigned)
      Established patient visit   Patient: Peter Wyatt   DOB: 11-Nov-1966   57 y.o. Male  MRN: [id number] Visit Date: 11/26/2023  Today's healthcare provider: Ronnald Ramp, MD   No chief complaint on file.  Subjective       Discussed the use of AI scribe software for clinical note transcription with the patient, who gave verbal consent to proceed.  History of Present Illness             Past Medical History:  Diagnosis Date   Atrial fibrillation (HCC)    Hypertension     Medications: Outpatient Medications Prior to Visit  Medication Sig   aspirin-acetaminophen-caffeine (EXCEDRIN MIGRAINE) 250-250-65 MG tablet Take 1 tablet by mouth every 6 (six) hours as needed for headache.   diltiazem (CARDIZEM CD) 300 MG 24 hr capsule Take 300 mg by mouth daily.   hydrocortisone (ANUSOL-HC) 2.5 % rectal cream Apply twice daily x 7 days   lamoTRIgine (LAMICTAL) 25 MG tablet Take 25 mg by mouth daily. Patient is tapering off   lisinopril (ZESTRIL) 20 MG tablet Take 1 tablet (20 mg total) by mouth daily.   nitroGLYCERIN (NITROSTAT) 0.4 MG SL tablet Place 1 tablet (0.4 mg total) under the tongue every 5 (five) minutes as needed for chest pain. Call 9-1-1 if you take the 3rd dose.   potassium chloride (KLOR-CON) 10 MEQ tablet Take 1 tablet by mouth 2 (two) times daily.   potassium chloride SA (KLOR-CON) 20 MEQ tablet Take 20 mEq by mouth 2 (two) times daily.   vitamin B-12 (CYANOCOBALAMIN) 1000 MCG tablet Take 1,000 mcg by mouth daily.   No facility-administered medications prior to visit.    Review of Systems  {Insert previous labs (optional):23779} {See past labs  Heme  Chem  Endocrine  Serology  Results Review (optional):1}   Objective    There were no vitals taken for this visit. {Insert last BP/Wt (optional):23777}{See vitals history (optional):1}    Physical Exam  ***  No results found for any visits on 11/26/23.  Assessment & Plan     Problem  List Items Addressed This Visit   None   Assessment and Plan              No follow-ups on file.         Ronnald Ramp, MD  [facility] [phone] (phone) [phone] (fax)  [facility]

## 2023-11-26 ENCOUNTER — Encounter: Payer: Self-pay | Admitting: Family Medicine

## 2023-11-26 ENCOUNTER — Ambulatory Visit: Payer: BC Managed Care – PPO | Admitting: Family Medicine

## 2023-11-26 VITALS — BP 132/84 | HR 72 | Resp 18 | Ht 71.0 in | Wt 159.6 lb

## 2023-11-26 DIAGNOSIS — Z131 Encounter for screening for diabetes mellitus: Secondary | ICD-10-CM | POA: Diagnosis not present

## 2023-11-26 DIAGNOSIS — R739 Hyperglycemia, unspecified: Secondary | ICD-10-CM | POA: Diagnosis not present

## 2023-11-26 DIAGNOSIS — Z13220 Encounter for screening for lipoid disorders: Secondary | ICD-10-CM | POA: Diagnosis not present

## 2023-11-26 DIAGNOSIS — I10 Essential (primary) hypertension: Secondary | ICD-10-CM

## 2023-11-26 DIAGNOSIS — Z8679 Personal history of other diseases of the circulatory system: Secondary | ICD-10-CM

## 2023-11-26 NOTE — Assessment & Plan Note (Addendum)
Hypertension Chronic hypertension, currently not well-controlled with diltiazem 300 mg daily. Blood pressure today is elevated at 153/108, likely due to stress and recent discontinuation of lisinopril. Patient has been off lisinopril for 5-6 weeks, with initial good control but recent fluctuations greater than 130/80 goal. Discussed the importance of maintaining blood pressure within target range to prevent complications such as heart failure. Explained that stress, pain, and diet can influence blood pressure. Patient prefers to avoid medication if possible but understands the need for control. - Restart lisinopril 20 mg daily - Continue diltiazem 300 mg daily - Check blood pressure at home and record readings - Follow up in 2 weeks to reassess blood pressure and review home readings - Order metabolic panel and lipid panel today - Recheck blood work in 2 weeks to monitor potassium and kidney function

## 2023-11-26 NOTE — Patient Instructions (Signed)
VISIT SUMMARY:  Today, we discussed your ongoing management of hypertension and atrial fibrillation. You have made significant lifestyle changes, including dietary modifications and increased physical activity, which have resulted in a 20-pound weight loss. Despite these efforts, your blood pressure readings have been inconsistent, and we have decided to resume medication to better control your hypertension. We also reviewed your current management of supraventricular tachycardia (SVT) and general health maintenance.  YOUR PLAN:  -HYPERTENSION: Hypertension, or high blood pressure, is a condition where the force of the blood against your artery walls is too high, which can lead to health problems such as heart disease. Your blood pressure today was elevated at 153/108, likely due to stress and stopping lisinopril. We have decided to restart lisinopril 20 mg daily and continue diltiazem 300 mg daily. Please check your blood pressure at home regularly and record the readings. We will follow up in 2 weeks to reassess your blood pressure and review your home readings. We have also ordered a metabolic panel and lipid panel today, and we will recheck your blood work in 2 weeks to monitor your potassium and kidney function.  -SUPRAVENTRICULAR TACHYCARDIA (SVT): Supraventricular tachycardia (SVT) is a condition where your heart suddenly beats much faster than normal. It is currently managed with diltiazem 300 mg daily, and you have not reported any recent episodes. Please continue taking diltiazem 300 mg daily and monitor for any symptoms of palpitations or rapid heartbeats. Follow up with cardiology if your symptoms worsen.  -GENERAL HEALTH MAINTENANCE: You have made significant lifestyle changes, including dietary modifications and increased physical activity, resulting in a 20-pound weight loss. These changes can positively impact your blood pressure and overall health. Please continue with your current dietary  modifications and physical activity. We have scheduled your annual physical for January 2025 with Dr. Sherrie Mustache. We will also monitor your cholesterol levels with a lipid panel today.  INSTRUCTIONS:  Please restart lisinopril 20 mg daily and continue taking diltiazem 300 mg daily. Check your blood pressure at home regularly and record the readings. Follow up in 2 weeks to reassess your blood pressure and review your home readings. We have ordered a metabolic panel and lipid panel today, and we will recheck your blood work in 2 weeks to monitor your potassium and kidney function. Continue with your current dietary modifications and physical activity. Your annual physical is scheduled for January 2025 with Dr. Sherrie Mustache. Monitor for any symptoms of palpitations or rapid heartbeats and follow up with cardiology if your symptoms worsen.

## 2023-11-26 NOTE — Assessment & Plan Note (Addendum)
Chronic SVT, currently managed with diltiazem 300 mg daily. No recent episodes reported. Patient experiences occasional palpitations but no sustained arrhythmias. Discussed the importance of monitoring for symptoms and the potential need for further cardiology follow-up if symptoms worsen. - Continue diltiazem 300 mg daily - Monitor for symptoms of palpitations or tachycardia - Follow up with cardiology as scheduled

## 2023-11-27 LAB — HEMOGLOBIN A1C
Est. average glucose Bld gHb Est-mCnc: 111 mg/dL
Hgb A1c MFr Bld: 5.5 % (ref 4.8–5.6)

## 2023-11-27 LAB — COMPREHENSIVE METABOLIC PANEL
ALT: 16 [IU]/L (ref 0–44)
AST: 18 [IU]/L (ref 0–40)
Albumin: 4.6 g/dL (ref 3.8–4.9)
Alkaline Phosphatase: 91 [IU]/L (ref 44–121)
BUN/Creatinine Ratio: 12 (ref 9–20)
BUN: 11 mg/dL (ref 6–24)
Bilirubin Total: 1 mg/dL (ref 0.0–1.2)
CO2: 26 mmol/L (ref 20–29)
Calcium: 9.6 mg/dL (ref 8.7–10.2)
Chloride: 103 mmol/L (ref 96–106)
Creatinine, Ser: 0.94 mg/dL (ref 0.76–1.27)
Globulin, Total: 2.1 g/dL (ref 1.5–4.5)
Glucose: 101 mg/dL — ABNORMAL HIGH (ref 70–99)
Potassium: 3.8 mmol/L (ref 3.5–5.2)
Sodium: 144 mmol/L (ref 134–144)
Total Protein: 6.7 g/dL (ref 6.0–8.5)
eGFR: 95 mL/min/{1.73_m2} (ref >59)

## 2023-11-27 LAB — LIPID PANEL
Chol/HDL Ratio: 2.5 {ratio} (ref 0.0–5.0)
Cholesterol, Total: 164 mg/dL (ref 100–199)
HDL: 65 mg/dL (ref >39)
LDL Chol Calc (NIH): 89 mg/dL (ref 0–99)
Triglycerides: 46 mg/dL (ref 0–149)
VLDL Cholesterol Cal: 10 mg/dL (ref 5–40)

## 2023-12-10 ENCOUNTER — Ambulatory Visit: Payer: BC Managed Care – PPO | Admitting: Family Medicine

## 2023-12-10 VITALS — BP 124/70 | HR 67 | Resp 16 | Ht 71.0 in | Wt 160.6 lb

## 2023-12-10 DIAGNOSIS — R5383 Other fatigue: Secondary | ICD-10-CM

## 2023-12-10 DIAGNOSIS — I10 Essential (primary) hypertension: Secondary | ICD-10-CM | POA: Diagnosis not present

## 2023-12-10 DIAGNOSIS — I480 Paroxysmal atrial fibrillation: Secondary | ICD-10-CM

## 2023-12-10 MED ORDER — DILTIAZEM HCL ER COATED BEADS 300 MG PO CP24: 300.0000 mg | ORAL_CAPSULE | Freq: Every day | ORAL | 1 refills | Status: DC

## 2023-12-10 NOTE — Patient Instructions (Signed)
VISIT SUMMARY:  You had a follow-up visit to review your hypertension and atrial fibrillation management. Your home blood pressure readings are normal, but you experienced elevated readings at the clinic, likely due to stress. You also reported chronic fatigue and concerns about low testosterone levels. Your physical examination was normal, and your cholesterol levels were slightly elevated.  YOUR PLAN:  -HYPERTENSION: Hypertension, or high blood pressure, is being managed with Lisinopril 20 mg and Diltiazem 300 mg daily. Your home readings are within the normal range. We will monitor your potassium and kidney function with a blood test. Please continue taking your medications as prescribed.  -ATRIAL FIBRILLATION: Atrial fibrillation is an irregular heart rhythm. It is being managed with Diltiazem 300 mg daily. No new symptoms were reported. Please continue taking your medication and follow up with cardiology as scheduled.  -FATIGUE: Chronic fatigue can have various causes, including low testosterone, anemia, thyroid issues, or iron deficiency. We will check your blood for these conditions. Depending on the results, we may consider testosterone supplementation.  -GENERAL HEALTH MAINTENANCE: Your cholesterol levels are slightly elevated, likely due to your diet. We discussed the importance of monitoring your cholesterol and being mindful of your dietary habits, especially egg consumption. Your glucose level is slightly high but not concerning at this time.  INSTRUCTIONS:  Please complete the following lab tests: Basic Metabolic Panel (BMP) to check potassium and creatinine, Complete Blood Count (CBC) to check for anemia, thyroid function tests, ferritin level to assess iron status, and testosterone level. Continue taking your medications as prescribed and follow up with cardiology as previously scheduled. We will review your lab results before deciding on any further action.

## 2023-12-10 NOTE — Assessment & Plan Note (Signed)
Chronic atrial fibrillation managed with diltiazem 300 mg daily. No new symptoms reported. Discussed the importance of rate control and the role of diltiazem in managing this condition. - well controlled  - Continue diltiazem 300 mg daily - Follow up with cardiology as previously scheduled

## 2023-12-10 NOTE — Progress Notes (Signed)
Established patient visit   Patient: Peter Wyatt   DOB: 11-26-1966   57 y.o. Male  MRN: [id number] Visit Date: 12/10/2023  Today's healthcare provider: Ronnald Ramp, MD   Chief Complaint  Patient presents with   Hypertension   Subjective       Discussed the use of AI scribe software for clinical note transcription with the patient, who gave verbal consent to proceed.  History of Present Illness   The patient is a 57 year old male with a history of hypertension and atrial fibrillation, who was seen for a follow-up visit. The patient's blood pressure was elevated at the last visit, prompting the re-initiation of Lisinopril 20mg  and continuation of Diltiazem 300mg  daily. The patient reported feeling well with no adverse effects from the medications.  The patient's home blood pressure readings have been consistently within the normal range, with systolic readings around 120 and diastolic readings in the 70s. However, the patient reported an increase in blood pressure readings at the clinic, which was attributed to the stress of the clinic visit and not reflective of his usual readings at home.  The patient also reported chronic fatigue and expressed concern about possible low testosterone levels. The patient denied any other new symptoms, including headaches or dizziness. The patient's heart rate was reported to be within the normal range.  The patient's cholesterol level was also slightly elevated, which the patient attributed to a diet high in eggs.  The patient's physical examination was unremarkable, with normal heart sounds and no signs of edema. The patient also had a skin lesion, which had been previously evaluated by a dermatologist and deemed benign.         Past Medical History:  Diagnosis Date   Atrial fibrillation (HCC)    Hypertension     Medications: Outpatient Medications Prior to Visit  Medication Sig   aspirin-acetaminophen-caffeine  (EXCEDRIN MIGRAINE) 250-250-65 MG tablet Take 1 tablet by mouth every 6 (six) hours as needed for headache.   hydrocortisone (ANUSOL-HC) 2.5 % rectal cream Apply twice daily x 7 days   lisinopril (ZESTRIL) 20 MG tablet Take 1 tablet (20 mg total) by mouth daily.   potassium chloride (KLOR-CON) 10 MEQ tablet Take 1 tablet by mouth 2 (two) times daily.   [DISCONTINUED] diltiazem (CARDIZEM CD) 300 MG 24 hr capsule Take 300 mg by mouth daily.   No facility-administered medications prior to visit.    Review of Systems  Last metabolic panel Lab Results  Component Value Date   GLUCOSE 101 (H) 11/26/2023   NA 144 11/26/2023   K 3.8 11/26/2023   CL 103 11/26/2023   CO2 26 11/26/2023   BUN 11 11/26/2023   CREATININE 0.94 11/26/2023   EGFR 95 11/26/2023   CALCIUM 9.6 11/26/2023   PHOS 3.6 04/09/2018   PROT 6.7 11/26/2023   ALBUMIN 4.6 11/26/2023   LABGLOB 2.1 11/26/2023   AGRATIO 2.1 09/01/2020   BILITOT 1.0 11/26/2023   ALKPHOS 91 11/26/2023   AST 18 11/26/2023   ALT 16 11/26/2023   ANIONGAP 9 04/24/2022        Objective    BP 124/70   Pulse 67   Resp 16   Ht 5\' 11"  (1.803 m)   Wt 160 lb 9.6 oz (72.8 kg)   SpO2 99%   BMI 22.40 kg/m  BP Readings from Last 3 Encounters:  12/10/23 124/70  11/26/23 132/84  03/10/23 138/85   Wt Readings from Last 3 Encounters:  12/10/23 160  lb 9.6 oz (72.8 kg)  11/26/23 159 lb 9.6 oz (72.4 kg)  03/10/23 172 lb (78 kg)        Physical Exam  General: Alert, no acute distress Cardio: Normal S1 and S2, RRR, no r/m/g Pulm: CTAB, normal work of breathing Abdomen: Bowel sounds normal. Abdomen soft and non-tender.  Extremities: No peripheral edema.    No results found for any visits on 12/10/23.  Assessment & Plan     Problem List Items Addressed This Visit       Cardiovascular and Mediastinum   Paroxysmal atrial fibrillation (HCC)   Chronic atrial fibrillation managed with diltiazem 300 mg daily. No new symptoms reported.  Discussed the importance of rate control and the role of diltiazem in managing this condition. - well controlled  - Continue diltiazem 300 mg daily - Follow up with cardiology as previously scheduled      Relevant Medications   diltiazem (CARDIZEM CD) 300 MG 24 hr capsule   Hypertension - Primary   Chronic hypertension with recent elevated readings. Blood pressure was elevated at the last visit on November 26, 2023. Restarted lisinopril 20 mg and continued diltiazem 300 mg daily. Current home readings are within acceptable range (119/78 to 127/82). No reported symptoms such as headaches or dizziness. Discussed the importance of monitoring potassium and kidney function due to the use of ACE inhibitors. - Order BMP to assess potassium and creatinine - Refill diltiazem 300 mg - Follow up with cardiology as previously scheduled      Relevant Medications   diltiazem (CARDIZEM CD) 300 MG 24 hr capsule   Other Relevant Orders   BMP8+EGFR   Other Visit Diagnoses       Other fatigue       Relevant Orders   Testosterone,Free and Total   CBC   Ferritin   TSH+T4F+T3Free             Fatigue Reported chronic fatigue. Differential diagnosis includes low testosterone, anemia, thyroid dysfunction, or iron deficiency. Discussed the potential need for testosterone supplementation if levels are low and the associated risks such as increased blood viscosity and clot risk. - Order CBC to check for anemia - Order thyroid function tests - Order ferritin level to assess iron status - Order testosterone level - Hold off on scheduling an appointment with Dr. Sherrie Mustache until lab results are available  General Health Maintenance Discussed cholesterol levels and dietary habits. Previous cholesterol levels slightly elevated but within acceptable range. Glucose level of 101 noted but not concerning. Discussed the impact of diet, particularly high egg consumption, on cholesterol levels. - Monitor  cholesterol levels - Continue current dietary habits with awareness of cholesterol intake - No immediate action required for glucose level.         No follow-ups on file.         Ronnald Ramp, MD  [facility] [phone] (phone) [phone] (fax)  [facility]

## 2023-12-10 NOTE — Assessment & Plan Note (Signed)
Chronic hypertension with recent elevated readings. Blood pressure was elevated at the last visit on November 26, 2023. Restarted lisinopril 20 mg and continued diltiazem 300 mg daily. Current home readings are within acceptable range (119/78 to 127/82). No reported symptoms such as headaches or dizziness. Discussed the importance of monitoring potassium and kidney function due to the use of ACE inhibitors. - Order BMP to assess potassium and creatinine - Refill diltiazem 300 mg - Follow up with cardiology as previously scheduled

## 2023-12-12 ENCOUNTER — Encounter: Payer: Self-pay | Admitting: Family Medicine

## 2023-12-12 ENCOUNTER — Other Ambulatory Visit: Payer: Self-pay | Admitting: Family Medicine

## 2023-12-12 LAB — BMP8+EGFR
BUN/Creatinine Ratio: 20 (ref 9–20)
BUN: 17 mg/dL (ref 6–24)
CO2: 24 mmol/L (ref 20–29)
Calcium: 9.5 mg/dL (ref 8.7–10.2)
Chloride: 104 mmol/L (ref 96–106)
Creatinine, Ser: 0.87 mg/dL (ref 0.76–1.27)
Glucose: 100 mg/dL — ABNORMAL HIGH (ref 70–99)
Potassium: 4.5 mmol/L (ref 3.5–5.2)
Sodium: 141 mmol/L (ref 134–144)
eGFR: 101 mL/min/{1.73_m2} (ref >59)

## 2023-12-12 LAB — TESTOSTERONE,FREE AND TOTAL
Testosterone, Free: 7.9 pg/mL (ref 7.2–24.0)
Testosterone: 726 ng/dL (ref 264–916)

## 2023-12-12 LAB — CBC
Hematocrit: 46 % (ref 37.5–51.0)
Hemoglobin: 15.4 g/dL (ref 13.0–17.7)
MCH: 29.6 pg (ref 26.6–33.0)
MCHC: 33.5 g/dL (ref 31.5–35.7)
MCV: 89 fL (ref 79–97)
Platelets: 166 10*3/uL (ref 150–450)
RBC: 5.2 x10E6/uL (ref 4.14–5.80)
RDW: 11.9 % (ref 11.6–15.4)
WBC: 3.6 10*3/uL (ref 3.4–10.8)

## 2023-12-12 LAB — FERRITIN: Ferritin: 79 ng/mL (ref 30–400)

## 2023-12-12 LAB — TSH+T4F+T3FREE
Free T4: 1.11 ng/dL (ref 0.82–1.77)
T3, Free: 3.2 pg/mL (ref 2.0–4.4)
TSH: 1.28 u[IU]/mL (ref 0.450–4.500)

## 2024-01-06 DIAGNOSIS — G5603 Carpal tunnel syndrome, bilateral upper limbs: Secondary | ICD-10-CM | POA: Diagnosis not present

## 2024-01-07 DIAGNOSIS — G5603 Carpal tunnel syndrome, bilateral upper limbs: Secondary | ICD-10-CM | POA: Diagnosis not present

## 2024-02-04 DIAGNOSIS — G5602 Carpal tunnel syndrome, left upper limb: Secondary | ICD-10-CM | POA: Diagnosis not present

## 2024-03-05 ENCOUNTER — Other Ambulatory Visit: Payer: BC Managed Care – PPO

## 2024-03-05 DIAGNOSIS — Z125 Encounter for screening for malignant neoplasm of prostate: Secondary | ICD-10-CM | POA: Diagnosis not present

## 2024-03-05 DIAGNOSIS — N401 Enlarged prostate with lower urinary tract symptoms: Secondary | ICD-10-CM

## 2024-03-06 LAB — PSA: Prostate Specific Ag, Serum: 0.8 ng/mL (ref 0.0–4.0)

## 2024-03-09 ENCOUNTER — Ambulatory Visit: Payer: BC Managed Care – PPO | Admitting: Urology

## 2024-03-10 ENCOUNTER — Ambulatory Visit: Payer: BC Managed Care – PPO | Admitting: Urology

## 2024-03-10 ENCOUNTER — Encounter: Payer: Self-pay | Admitting: Urology

## 2024-03-10 VITALS — BP 137/84 | HR 69 | Ht 71.0 in | Wt 158.0 lb

## 2024-03-10 DIAGNOSIS — Z87898 Personal history of other specified conditions: Secondary | ICD-10-CM

## 2024-03-10 NOTE — Progress Notes (Signed)
 I, Peter Wyatt, acting as a scribe for Peter Altes, MD., have documented all relevant documentation on the behalf of Peter Altes, MD, as directed by Peter Altes, MD while in the presence of Peter Altes, MD.  03/10/2024 11:26 AM   [name] 15-Jul-1966 [id number]  Referring provider: Malva Limes, MD [address] [place],  [place] [address]  Chief Complaint  Patient presents with   Elevated PSA   Urologic history: 1.  Hematospermia   2.  Abnormal PSA velocity PSA bump 0.5-1.5 Prostate MRI 12/2021 PI-RADS 3 lesion right anterior transition zone; 30 cc prostate MRI fusion biopsy 02/01/2022 16/16 cores negative  HPI: Peter Wyatt is a 58 y.o. male presents for annual follow-up.  Doing well since last visit No bothersome LUTS Denies flank, abdominal or pelvic pain PSA 03/05/24 was 0.8  PSA   Prostate Specific Ag, Serum  Latest Ref Rng 0.0 - 4.0 ng/mL  06/29/2019 0.5   09/01/2020 1.0   11/13/2021 1.5   03/05/2023 1.4   03/05/2024 0.8      PMH: Past Medical History:  Diagnosis Date   Atrial fibrillation (HCC)    Hypertension     Home Medications:  Allergies as of 03/10/2024   No Known Allergies      Medication List        Accurate as of March 10, 2024 11:26 AM. If you have any questions, ask your nurse or doctor.          aspirin-acetaminophen-caffeine 250-250-65 MG tablet Commonly known as: EXCEDRIN MIGRAINE Take 1 tablet by mouth every 6 (six) hours as needed for headache.   diltiazem 300 MG 24 hr capsule Commonly known as: CARDIZEM CD Take 1 capsule (300 mg total) by mouth daily.   hydrocortisone 2.5 % rectal cream Commonly known as: Anusol-HC Apply twice daily x 7 days   lisinopril 20 MG tablet Commonly known as: ZESTRIL Take 1 tablet (20 mg total) by mouth daily.   potassium chloride 10 MEQ tablet Commonly known as: KLOR-CON Take 1 tablet by mouth 2 (two) times daily.        Allergies: No  Known Allergies  Family History: Family History  Problem Relation Age of Onset   Hypertension Father    Prostate cancer Father    Prostate cancer Paternal Grandfather    Colon cancer Neg Hx     Social History:  reports that he has never smoked. He has never used smokeless tobacco. He reports that he does not drink alcohol and does not use drugs.   Physical Exam: BP 137/84   Pulse 69   Ht 5\' 11"  (1.803 m)   Wt 158 lb (71.7 kg)   BMI 22.04 kg/m   Constitutional:  Alert and oriented, No acute distress. HEENT: [place] AT Respiratory: Normal respiratory effort, no increased work of breathing. Psychiatric: Normal mood and affect.  Assessment & Plan:    1. History of abnormal PSA velocity Previous benign prostate biopsy and PSA back to baseline.  Offered him annual visit versus PRN and having his PCP check his PSA annually.  He elected annual PSA with his PCP.  We discussed prostate cancer screening guidelines do recommend a DRE, however using shared decision-making he elected a PSA alone, which will diagnose the majority of prostate cancers.  I have reviewed the above documentation for accuracy and completeness, and I agree with the above.   Peter Altes, MD  [facility] [address], [address] [place], [place] [address] [phone]

## 2024-03-25 ENCOUNTER — Other Ambulatory Visit: Payer: Self-pay | Admitting: Physician Assistant

## 2024-03-25 DIAGNOSIS — I10 Essential (primary) hypertension: Secondary | ICD-10-CM

## 2024-03-30 ENCOUNTER — Other Ambulatory Visit: Payer: Self-pay | Admitting: Family Medicine

## 2024-03-30 DIAGNOSIS — I10 Essential (primary) hypertension: Secondary | ICD-10-CM

## 2024-03-30 NOTE — Telephone Encounter (Unsigned)
 Copied from CRM [id number]. Topic: Clinical - Medication Refill >> Mar 30, 2024  1:16 PM Turkey B wrote: Most Recent Primary Care Visit:  Provider: Ronnald Ramp  Department: [id number]  Visit Type: OFFICE VISIT  Date: 12/10/2023  Medication: lisinopril (ZESTRIL) 20 MG tablet   Has the patient contacted their pharmacy? yes (Agent: If yes, when and what did the pharmacy advise?)contact pcp/ med was denied because no longer under Dr Lillia Abed, pt with /Dr Sherrie Mustache now  Is this the correct pharmacy for this prescription? yes   This is the patient's preferred pharmacy:  [facility] [address], [place] - [address] [place] [address] Phone: [phone] Fax: [phone]    Has the prescription been filled recently? no  Is the patient out of the medication? yes  Has the patient been seen for an appointment in the last year OR does the patient have an upcoming appointment? yes  Can we respond through MyChart? yes  Agent: Please be advised that Rx refills may take up to 3 business days. We ask that you follow-up with your pharmacy.

## 2024-03-31 MED ORDER — LISINOPRIL 20 MG PO TABS: 20.0000 mg | ORAL_TABLET | Freq: Every day | ORAL | 0 refills | Status: DC

## 2024-03-31 NOTE — Telephone Encounter (Signed)
 OV 12/10/23 Requested Prescriptions  Pending Prescriptions Disp Refills   lisinopril (ZESTRIL) 20 MG tablet 90 tablet 3    Sig: Take 1 tablet (20 mg total) by mouth daily.     Cardiovascular:  ACE Inhibitors Failed - 03/31/2024  1:08 PM      Failed - Valid encounter within last 6 months    Recent Outpatient Visits   None            Passed - Cr in normal range and within 180 days    Creatinine, Ser  Date Value Ref Range Status  12/10/2023 0.87 0.76 - 1.27 mg/dL Final         Passed - K in normal range and within 180 days    Potassium  Date Value Ref Range Status  12/10/2023 4.5 3.5 - 5.2 mmol/L Final         Passed - Patient is not pregnant      Passed - Last BP in normal range    BP Readings from Last 1 Encounters:  03/10/24 137/84

## 2024-06-11 ENCOUNTER — Other Ambulatory Visit: Payer: Self-pay | Admitting: Family Medicine

## 2024-06-11 DIAGNOSIS — I480 Paroxysmal atrial fibrillation: Secondary | ICD-10-CM

## 2024-06-11 NOTE — Telephone Encounter (Signed)
 [facility] is requesting refill diltiazem  (CARDIZEM  CD) 300 MG 24 hr capsule  Please advise

## 2024-06-11 NOTE — Telephone Encounter (Unsigned)
 Copied from CRM [id number]. Topic: Clinical - Medication Refill >> Jun 11, 2024  9:16 AM Oddis Bench wrote: Medication: diltiazem  (CARDIZEM  CD) 300 MG 24 hr capsule  Has the patient contacted their pharmacy? Yes Had to call in to provider  This is the patient's preferred pharmacy:  [facility] [address] [place] - [address] [place] [address] Phone: [phone] Fax: [phone]  Is this the correct pharmacy for this prescription? Yes If no, delete pharmacy and type the correct one.   Has the prescription been filled recently? Yes  Is the patient out of the medication? Yes  Has the patient been seen for an appointment in the last year OR does the patient have an upcoming appointment? Yes  Can we respond through MyChart? Yes  Agent: Please be advised that Rx refills may take up to 3 business days. We ask that you follow-up with your pharmacy.

## 2024-06-13 MED ORDER — DILTIAZEM HCL ER COATED BEADS 300 MG PO CP24: 300.0000 mg | ORAL_CAPSULE | Freq: Every day | ORAL | 1 refills | Status: DC

## 2024-06-14 NOTE — Telephone Encounter (Signed)
 Requested Prescriptions  Refused Prescriptions Disp Refills   diltiazem  (CARDIZEM  CD) 300 MG 24 hr capsule 90 capsule 1    Sig: Take 1 capsule (300 mg total) by mouth daily.     Cardiovascular: Calcium Channel Blockers 3 Failed - 06/14/2024  5:03 PM      Failed - Valid encounter within last 6 months    Recent Outpatient Visits   None            Passed - ALT in normal range and within 360 days    ALT  Date Value Ref Range Status  11/26/2023 16 0 - 44 IU/L Final         Passed - AST in normal range and within 360 days    AST  Date Value Ref Range Status  11/26/2023 18 0 - 40 IU/L Final         Passed - Cr in normal range and within 360 days    Creatinine, Ser  Date Value Ref Range Status  12/10/2023 0.87 0.76 - 1.27 mg/dL Final         Passed - Last BP in normal range    BP Readings from Last 1 Encounters:  03/10/24 137/84         Passed - Last Heart Rate in normal range    Pulse Readings from Last 1 Encounters:  03/10/24 69

## 2024-06-28 ENCOUNTER — Other Ambulatory Visit: Payer: Self-pay | Admitting: Family Medicine

## 2024-06-28 DIAGNOSIS — I10 Essential (primary) hypertension: Secondary | ICD-10-CM

## 2024-07-06 DIAGNOSIS — Z1331 Encounter for screening for depression: Secondary | ICD-10-CM | POA: Diagnosis not present

## 2024-07-06 DIAGNOSIS — G939 Disorder of brain, unspecified: Secondary | ICD-10-CM | POA: Diagnosis not present

## 2024-07-06 DIAGNOSIS — G5603 Carpal tunnel syndrome, bilateral upper limbs: Secondary | ICD-10-CM | POA: Diagnosis not present

## 2024-07-13 DIAGNOSIS — G5602 Carpal tunnel syndrome, left upper limb: Secondary | ICD-10-CM | POA: Diagnosis not present

## 2024-07-31 ENCOUNTER — Other Ambulatory Visit: Payer: Self-pay | Admitting: Family Medicine

## 2024-07-31 DIAGNOSIS — I10 Essential (primary) hypertension: Secondary | ICD-10-CM

## 2024-08-03 ENCOUNTER — Other Ambulatory Visit: Payer: Self-pay | Admitting: Family Medicine

## 2024-08-03 DIAGNOSIS — I10 Essential (primary) hypertension: Secondary | ICD-10-CM

## 2024-08-03 NOTE — Telephone Encounter (Unsigned)
 Copied from CRM [id number]. Topic: Clinical - Medication Refill >> Aug 03, 2024  2:50 PM Sophia H wrote: Medication: lisinopril  (ZESTRIL ) 20 MG tablet   Has the patient contacted their pharmacy? Yes, told no refills on file   This is the patient's preferred pharmacy:  [facility] [address] [place] - [address] [place] [address] Phone: [phone] Fax: [phone]  Is this the correct pharmacy for this prescription? Yes If no, delete pharmacy and type the correct one.   Has the prescription been filled recently? Yes  Is the patient out of the medication? Yes  Has the patient been seen for an appointment in the last year OR does the patient have an upcoming appointment? Yes, August 18th with PCP. Needing enough to get him to med check   Can we respond through MyChart? Yes  Agent: Please be advised that Rx refills may take up to 3 business days. We ask that you follow-up with your pharmacy.

## 2024-08-04 DIAGNOSIS — R002 Palpitations: Secondary | ICD-10-CM | POA: Diagnosis not present

## 2024-08-04 DIAGNOSIS — I10 Essential (primary) hypertension: Secondary | ICD-10-CM | POA: Diagnosis not present

## 2024-08-04 DIAGNOSIS — I471 Supraventricular tachycardia, unspecified: Secondary | ICD-10-CM | POA: Diagnosis not present

## 2024-08-04 DIAGNOSIS — I480 Paroxysmal atrial fibrillation: Secondary | ICD-10-CM | POA: Diagnosis not present

## 2024-08-06 NOTE — Telephone Encounter (Signed)
 Refilled 08/02/24 #30. Requested Prescriptions  Refused Prescriptions Disp Refills   lisinopril  (ZESTRIL ) 20 MG tablet 30 tablet 0    Sig: Take 1 tablet (20 mg total) by mouth daily.     Cardiovascular:  ACE Inhibitors Failed - 08/06/2024 11:32 AM      Failed - Cr in normal range and within 180 days    Creatinine, Ser  Date Value Ref Range Status  12/10/2023 0.87 0.76 - 1.27 mg/dL Final         Failed - K in normal range and within 180 days    Potassium  Date Value Ref Range Status  12/10/2023 4.5 3.5 - 5.2 mmol/L Final         Failed - Valid encounter within last 6 months    Recent Outpatient Visits   None            Passed - Patient is not pregnant      Passed - Last BP in normal range    BP Readings from Last 1 Encounters:  03/10/24 137/84

## 2024-08-09 ENCOUNTER — Ambulatory Visit: Admitting: Family Medicine

## 2024-08-31 ENCOUNTER — Other Ambulatory Visit: Payer: Self-pay | Admitting: Family Medicine

## 2024-08-31 DIAGNOSIS — I10 Essential (primary) hypertension: Secondary | ICD-10-CM

## 2024-09-22 ENCOUNTER — Ambulatory Visit: Admitting: Family Medicine

## 2024-09-22 ENCOUNTER — Encounter: Payer: Self-pay | Admitting: Family Medicine

## 2024-09-22 VITALS — BP 136/90 | HR 66 | Resp 14 | Ht 71.0 in | Wt 164.5 lb

## 2024-09-22 DIAGNOSIS — I10 Essential (primary) hypertension: Secondary | ICD-10-CM

## 2024-09-22 DIAGNOSIS — Z23 Encounter for immunization: Secondary | ICD-10-CM | POA: Diagnosis not present

## 2024-09-22 MED ORDER — LISINOPRIL 20 MG PO TABS: 20.0000 mg | ORAL_TABLET | Freq: Every day | ORAL | 3 refills | Status: AC

## 2024-09-22 NOTE — Progress Notes (Signed)
 Patient is in office today for a nurse visit for Immunization. Patient Injection was given in the  Left deltoid. Patient tolerated injection well.

## 2024-09-22 NOTE — Progress Notes (Signed)
 Established patient visit   Patient: Peter Wyatt   DOB: 1966/01/09   58 y.o. Male  MRN: [id number] Visit Date: 09/22/2024  Today's healthcare provider: Nancyann Perry, MD   Chief Complaint  Patient presents with   Hypertension    BP has been good at home, bottom number stays around 80's most of the time   Subjective    Discussed the use of AI scribe software for clinical note transcription with the patient, who gave verbal consent to proceed.  History of Present Illness   Peter Wyatt is a 58 year old male with hypertension who presents for a follow-up on blood pressure management.  He has been monitoring his blood pressure at home, with systolic readings generally in the 110s to 120s and diastolic readings around the 70s. Occasionally, the diastolic number reaches 85 to 88. He reports that his blood pressure seems to be higher when measured in the office.  He has a history of supraventricular tachycardia (SVT), which has significantly improved since he and his wife changed their diet last September. They eliminated high fructose corn syrup, vegetable oil, and seed oil, opting for avocado and coconut oils instead. He also stopped drinking water from plastic bottles, switching to glass, which he believes has contributed to the reduction in SVT episodes. Previously frequent, the SVT episodes have become rare since these changes.  He recently saw a cardiologist and reports no episodes of atrial fibrillation. He discusses his prostate health, noting that his PSA levels have decreased from 1.5 to 1.4 over the past year. He underwent a prostate biopsy in the past due to elevated PSA levels.  He is concerned about a decrease in his white blood cell count, which dropped to 3.6. 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     Lab Results  Component Value Date    NA 141 12/10/2023   CL 104 12/10/2023   K 4.5 12/10/2023   CO2 24 12/10/2023   BUN 17 12/10/2023   CREATININE 0.87 12/10/2023   EGFR 101 12/10/2023   CALCIUM 9.5 12/10/2023   PHOS 3.6 04/09/2018   ALBUMIN 4.6 11/26/2023   GLUCOSE 100 (H) 12/10/2023   Lab Results  Component Value Date   CHOL 164 11/26/2023   HDL 65 11/26/2023   LDLCALC 89 11/26/2023   TRIG 46 11/26/2023   CHOLHDL 2.5 11/26/2023     Medications: Outpatient Medications Prior to Visit  Medication Sig   aspirin-acetaminophen -caffeine (EXCEDRIN MIGRAINE) 250-250-65 MG tablet Take 1 tablet by mouth every 6 (six) hours as needed for headache.   diltiazem  (CARDIZEM  CD) 300 MG 24 hr capsule Take 1 capsule (300 mg total) by mouth daily.   hydrocortisone  (ANUSOL -HC) 2.5 % rectal cream Apply twice daily x 7 days   potassium chloride  (KLOR-CON ) 10 MEQ tablet Take 1 tablet by mouth 2 (two) times daily.   [DISCONTINUED] lisinopril  (ZESTRIL ) 20 MG tablet Take 1 tablet by mouth once daily   No facility-administered medications prior to visit.   Review of Systems  Constitutional:  Negative for appetite change, chills and fever.  Respiratory:  Negative for chest tightness, shortness of breath and wheezing.   Cardiovascular:  Negative for chest pain and palpitations.  Gastrointestinal:  Negative for abdominal pain, nausea and vomiting.       Objective    BP (!) 136/90  Pulse 66   Resp 14   Ht 5' 11 (1.803 m)   Wt 164 lb 8 oz (74.6 kg)   SpO2 100%   BMI 22.94 kg/m   Physical Exam   General appearance: Well developed, well nourished male, cooperative and in no acute distress Head: Normocephalic, without obvious abnormality, atraumatic Respiratory: Respirations even and unlabored, normal respiratory rate Extremities: All extremities are intact.  Skin: Skin color, texture, turgor normal. No rashes seen  Psych: Appropriate mood and affect. Neurologic: Mental status: Alert, oriented to person, place, and time,  thought content appropriate.    Assessment & Plan     1. Primary hypertension (Primary) Well controlled with home Bps much better than in office reading.   - lisinopril  (ZESTRIL ) 20 MG tablet; Take 1 tablet (20 mg total) by mouth daily.  Dispense: 90 tablet; Refill: 3  2. Need for influenza vaccination  - Flu vaccine trivalent PF, 6mos and older(Flulaval,Afluria,Fluarix,Fluzone)   Return in about 6 months (around 03/23/2025) for Yearly Physical.     Nancyann Perry, MD  [facility] [phone] (phone) [phone] (fax)  [facility]

## 2024-09-22 NOTE — Patient Instructions (Signed)
 SABRA  Please review the attached list of medications and notify my office if there are any errors.   . Please bring all of your medications to every appointment so we can make sure that our medication list is the same as yours.

## 2024-10-14 DIAGNOSIS — G5602 Carpal tunnel syndrome, left upper limb: Secondary | ICD-10-CM | POA: Diagnosis not present

## 2024-12-04 ENCOUNTER — Other Ambulatory Visit: Payer: Self-pay | Admitting: Family Medicine

## 2024-12-04 DIAGNOSIS — I480 Paroxysmal atrial fibrillation: Secondary | ICD-10-CM

## 2025-03-30 ENCOUNTER — Encounter: Admitting: Family Medicine
